# Patient Record
Sex: Male | Born: 1950 | Race: Black or African American | Hispanic: No | Marital: Single | Smoking: Former smoker
Health system: Southern US, Community
[De-identification: ages and names within clinical notes are randomized; demographics above are authoritative.]

## PROBLEM LIST (undated history)

## (undated) DIAGNOSIS — N400 Enlarged prostate without lower urinary tract symptoms: Secondary | ICD-10-CM

## (undated) DIAGNOSIS — N183 Chronic kidney disease, stage 3 unspecified: Secondary | ICD-10-CM

## (undated) DIAGNOSIS — E119 Type 2 diabetes mellitus without complications: Secondary | ICD-10-CM

## (undated) DIAGNOSIS — E559 Vitamin D deficiency, unspecified: Secondary | ICD-10-CM

## (undated) DIAGNOSIS — I10 Essential (primary) hypertension: Secondary | ICD-10-CM

## (undated) DIAGNOSIS — I639 Cerebral infarction, unspecified: Secondary | ICD-10-CM

## (undated) HISTORY — PX: NO PAST SURGERIES: SHX2092

## (undated) HISTORY — DX: Cerebral infarction, unspecified: I63.9

---

## 2016-08-03 ENCOUNTER — Emergency Department (HOSPITAL_COMMUNITY): Payer: BLUE CROSS/BLUE SHIELD

## 2016-08-03 ENCOUNTER — Inpatient Hospital Stay (HOSPITAL_COMMUNITY): Payer: BLUE CROSS/BLUE SHIELD

## 2016-08-03 ENCOUNTER — Inpatient Hospital Stay (HOSPITAL_COMMUNITY)
Admission: EM | Admit: 2016-08-03 | Discharge: 2016-08-07 | DRG: 065 | Disposition: A | Discharge status: Home Health | Payer: BLUE CROSS/BLUE SHIELD | Attending: Internal Medicine | Admitting: Internal Medicine

## 2016-08-03 ENCOUNTER — Other Ambulatory Visit: Payer: Self-pay | Admitting: Internal Medicine

## 2016-08-03 ENCOUNTER — Encounter (HOSPITAL_COMMUNITY): Payer: Self-pay | Admitting: Emergency Medicine

## 2016-08-03 DIAGNOSIS — R270 Ataxia, unspecified: Secondary | ICD-10-CM | POA: Diagnosis present

## 2016-08-03 DIAGNOSIS — I10 Essential (primary) hypertension: Secondary | ICD-10-CM

## 2016-08-03 DIAGNOSIS — Z8673 Personal history of transient ischemic attack (TIA), and cerebral infarction without residual deficits: Secondary | ICD-10-CM

## 2016-08-03 DIAGNOSIS — I6302 Cerebral infarction due to thrombosis of basilar artery: Secondary | ICD-10-CM

## 2016-08-03 DIAGNOSIS — Z9181 History of falling: Secondary | ICD-10-CM

## 2016-08-03 DIAGNOSIS — R748 Abnormal levels of other serum enzymes: Secondary | ICD-10-CM | POA: Diagnosis not present

## 2016-08-03 DIAGNOSIS — R29700 NIHSS score 0: Secondary | ICD-10-CM | POA: Diagnosis not present

## 2016-08-03 DIAGNOSIS — E1151 Type 2 diabetes mellitus with diabetic peripheral angiopathy without gangrene: Secondary | ICD-10-CM | POA: Diagnosis not present

## 2016-08-03 DIAGNOSIS — E1169 Type 2 diabetes mellitus with other specified complication: Secondary | ICD-10-CM | POA: Diagnosis not present

## 2016-08-03 DIAGNOSIS — E559 Vitamin D deficiency, unspecified: Secondary | ICD-10-CM | POA: Diagnosis present

## 2016-08-03 DIAGNOSIS — R938 Abnormal findings on diagnostic imaging of other specified body structures: Secondary | ICD-10-CM | POA: Diagnosis not present

## 2016-08-03 DIAGNOSIS — G510 Bell's palsy: Secondary | ICD-10-CM | POA: Diagnosis present

## 2016-08-03 DIAGNOSIS — R278 Other lack of coordination: Secondary | ICD-10-CM | POA: Diagnosis present

## 2016-08-03 DIAGNOSIS — N390 Urinary tract infection, site not specified: Secondary | ICD-10-CM | POA: Diagnosis not present

## 2016-08-03 DIAGNOSIS — I639 Cerebral infarction, unspecified: Principal | ICD-10-CM | POA: Diagnosis present

## 2016-08-03 DIAGNOSIS — I129 Hypertensive chronic kidney disease with stage 1 through stage 4 chronic kidney disease, or unspecified chronic kidney disease: Secondary | ICD-10-CM | POA: Diagnosis present

## 2016-08-03 DIAGNOSIS — Z8249 Family history of ischemic heart disease and other diseases of the circulatory system: Secondary | ICD-10-CM | POA: Diagnosis not present

## 2016-08-03 DIAGNOSIS — E785 Hyperlipidemia, unspecified: Secondary | ICD-10-CM | POA: Diagnosis not present

## 2016-08-03 DIAGNOSIS — Z7984 Long term (current) use of oral hypoglycemic drugs: Secondary | ICD-10-CM | POA: Diagnosis not present

## 2016-08-03 DIAGNOSIS — Z713 Dietary counseling and surveillance: Secondary | ICD-10-CM

## 2016-08-03 DIAGNOSIS — Z888 Allergy status to other drugs, medicaments and biological substances status: Secondary | ICD-10-CM

## 2016-08-03 DIAGNOSIS — R42 Dizziness and giddiness: Secondary | ICD-10-CM

## 2016-08-03 DIAGNOSIS — I509 Heart failure, unspecified: Secondary | ICD-10-CM | POA: Diagnosis present

## 2016-08-03 DIAGNOSIS — I638 Other cerebral infarction: Secondary | ICD-10-CM | POA: Diagnosis not present

## 2016-08-03 DIAGNOSIS — N400 Enlarged prostate without lower urinary tract symptoms: Secondary | ICD-10-CM | POA: Diagnosis not present

## 2016-08-03 DIAGNOSIS — N183 Chronic kidney disease, stage 3 unspecified: Secondary | ICD-10-CM | POA: Diagnosis present

## 2016-08-03 DIAGNOSIS — H532 Diplopia: Secondary | ICD-10-CM | POA: Diagnosis not present

## 2016-08-03 DIAGNOSIS — E237 Disorder of pituitary gland, unspecified: Secondary | ICD-10-CM | POA: Diagnosis not present

## 2016-08-03 DIAGNOSIS — R7989 Other specified abnormal findings of blood chemistry: Secondary | ICD-10-CM

## 2016-08-03 DIAGNOSIS — I6789 Other cerebrovascular disease: Secondary | ICD-10-CM | POA: Diagnosis not present

## 2016-08-03 DIAGNOSIS — E669 Obesity, unspecified: Secondary | ICD-10-CM | POA: Diagnosis not present

## 2016-08-03 DIAGNOSIS — G459 Transient cerebral ischemic attack, unspecified: Secondary | ICD-10-CM

## 2016-08-03 DIAGNOSIS — E876 Hypokalemia: Secondary | ICD-10-CM | POA: Diagnosis present

## 2016-08-03 DIAGNOSIS — F17210 Nicotine dependence, cigarettes, uncomplicated: Secondary | ICD-10-CM | POA: Diagnosis not present

## 2016-08-03 DIAGNOSIS — E1122 Type 2 diabetes mellitus with diabetic chronic kidney disease: Secondary | ICD-10-CM | POA: Diagnosis present

## 2016-08-03 DIAGNOSIS — Z6835 Body mass index (BMI) 35.0-35.9, adult: Secondary | ICD-10-CM

## 2016-08-03 DIAGNOSIS — R930 Abnormal findings on diagnostic imaging of skull and head, not elsewhere classified: Secondary | ICD-10-CM

## 2016-08-03 DIAGNOSIS — J208 Acute bronchitis due to other specified organisms: Secondary | ICD-10-CM | POA: Diagnosis not present

## 2016-08-03 DIAGNOSIS — D62 Acute posthemorrhagic anemia: Secondary | ICD-10-CM | POA: Diagnosis present

## 2016-08-03 DIAGNOSIS — Z716 Tobacco abuse counseling: Secondary | ICD-10-CM

## 2016-08-03 DIAGNOSIS — D72829 Elevated white blood cell count, unspecified: Secondary | ICD-10-CM

## 2016-08-03 DIAGNOSIS — I519 Heart disease, unspecified: Secondary | ICD-10-CM | POA: Diagnosis not present

## 2016-08-03 DIAGNOSIS — I5189 Other ill-defined heart diseases: Secondary | ICD-10-CM

## 2016-08-03 DIAGNOSIS — I6300 Cerebral infarction due to thrombosis of unspecified precerebral artery: Secondary | ICD-10-CM

## 2016-08-03 DIAGNOSIS — E236 Other disorders of pituitary gland: Secondary | ICD-10-CM

## 2016-08-03 DIAGNOSIS — Z79899 Other long term (current) drug therapy: Secondary | ICD-10-CM

## 2016-08-03 HISTORY — DX: Type 2 diabetes mellitus without complications: E11.9

## 2016-08-03 HISTORY — DX: Chronic kidney disease, stage 3 (moderate): N18.3

## 2016-08-03 HISTORY — DX: Chronic kidney disease, stage 3 unspecified: N18.30

## 2016-08-03 HISTORY — DX: Essential (primary) hypertension: I10

## 2016-08-03 HISTORY — DX: Vitamin D deficiency, unspecified: E55.9

## 2016-08-03 HISTORY — DX: Benign prostatic hyperplasia without lower urinary tract symptoms: N40.0

## 2016-08-03 LAB — I-STAT CHEM 8, ED
BUN: 22 mg/dL — ABNORMAL HIGH (ref 6–20)
CALCIUM ION: 1.02 mmol/L — AB (ref 1.15–1.40)
Chloride: 99 mmol/L — ABNORMAL LOW (ref 101–111)
Creatinine, Ser: 1.8 mg/dL — ABNORMAL HIGH (ref 0.61–1.24)
Glucose, Bld: 127 mg/dL — ABNORMAL HIGH (ref 65–99)
HEMATOCRIT: 37 % — AB (ref 39.0–52.0)
Hemoglobin: 12.6 g/dL — ABNORMAL LOW (ref 13.0–17.0)
Potassium: 2.6 mmol/L — CL (ref 3.5–5.1)
SODIUM: 138 mmol/L (ref 135–145)
TCO2: 25 mmol/L (ref 0–100)

## 2016-08-03 LAB — URINALYSIS, ROUTINE W REFLEX MICROSCOPIC
BILIRUBIN URINE: NEGATIVE
Glucose, UA: NEGATIVE mg/dL
KETONES UR: NEGATIVE mg/dL
NITRITE: NEGATIVE
Specific Gravity, Urine: 1.018 (ref 1.005–1.030)
pH: 6 (ref 5.0–8.0)

## 2016-08-03 LAB — COMPREHENSIVE METABOLIC PANEL
ALK PHOS: 81 U/L (ref 38–126)
ALT: 12 U/L — ABNORMAL LOW (ref 17–63)
ANION GAP: 12 (ref 5–15)
AST: 18 U/L (ref 15–41)
Albumin: 3.2 g/dL — ABNORMAL LOW (ref 3.5–5.0)
BUN: 23 mg/dL — ABNORMAL HIGH (ref 6–20)
CALCIUM: 8.6 mg/dL — AB (ref 8.9–10.3)
CO2: 26 mmol/L (ref 22–32)
Chloride: 98 mmol/L — ABNORMAL LOW (ref 101–111)
Creatinine, Ser: 1.94 mg/dL — ABNORMAL HIGH (ref 0.61–1.24)
GFR, EST AFRICAN AMERICAN: 40 mL/min — AB (ref >60)
GFR, EST NON AFRICAN AMERICAN: 35 mL/min — AB (ref >60)
Glucose, Bld: 132 mg/dL — ABNORMAL HIGH (ref 65–99)
Potassium: 2.7 mmol/L — CL (ref 3.5–5.1)
SODIUM: 136 mmol/L (ref 135–145)
Total Bilirubin: 1.1 mg/dL (ref 0.3–1.2)
Total Protein: 7.8 g/dL (ref 6.5–8.1)

## 2016-08-03 LAB — DIFFERENTIAL
BASOS PCT: 0 %
Basophils Absolute: 0 10*3/uL (ref 0.0–0.1)
EOS PCT: 0 %
Eosinophils Absolute: 0 10*3/uL (ref 0.0–0.7)
LYMPHS PCT: 12 %
Lymphs Abs: 1.6 10*3/uL (ref 0.7–4.0)
MONO ABS: 1.3 10*3/uL — AB (ref 0.1–1.0)
Monocytes Relative: 10 %
Neutro Abs: 10.7 10*3/uL — ABNORMAL HIGH (ref 1.7–7.7)
Neutrophils Relative %: 78 %

## 2016-08-03 LAB — CBC
HCT: 36.6 % — ABNORMAL LOW (ref 39.0–52.0)
Hemoglobin: 12.1 g/dL — ABNORMAL LOW (ref 13.0–17.0)
MCH: 25.5 pg — AB (ref 26.0–34.0)
MCHC: 33.1 g/dL (ref 30.0–36.0)
MCV: 77.1 fL — AB (ref 78.0–100.0)
PLATELETS: 219 10*3/uL (ref 150–400)
RBC: 4.75 MIL/uL (ref 4.22–5.81)
RDW: 14.1 % (ref 11.5–15.5)
WBC: 13.7 10*3/uL — ABNORMAL HIGH (ref 4.0–10.5)

## 2016-08-03 LAB — RAPID URINE DRUG SCREEN, HOSP PERFORMED
AMPHETAMINES: NOT DETECTED
Barbiturates: NOT DETECTED
Benzodiazepines: NOT DETECTED
Cocaine: NOT DETECTED
Opiates: NOT DETECTED
Tetrahydrocannabinol: NOT DETECTED

## 2016-08-03 LAB — PROTIME-INR
INR: 1.05
PROTHROMBIN TIME: 13.7 s (ref 11.4–15.2)

## 2016-08-03 LAB — APTT: aPTT: 21 seconds — ABNORMAL LOW (ref 24–36)

## 2016-08-03 LAB — I-STAT TROPONIN, ED: TROPONIN I, POC: 0 ng/mL (ref 0.00–0.08)

## 2016-08-03 LAB — GLUCOSE, CAPILLARY: Glucose-Capillary: 120 mg/dL — ABNORMAL HIGH (ref 65–99)

## 2016-08-03 LAB — ETHANOL: Alcohol, Ethyl (B): <5 mg/dL (ref <5)

## 2016-08-03 LAB — MAGNESIUM: MAGNESIUM: 2.2 mg/dL (ref 1.7–2.4)

## 2016-08-03 LAB — TROPONIN I: Troponin I: 0.11 ng/mL (ref <0.03)

## 2016-08-03 MED ORDER — DOXAZOSIN MESYLATE 8 MG PO TABS
8.0000 mg | ORAL_TABLET | Freq: Every day | ORAL | Status: DC
  Administered 2016-08-03 – 2016-08-06 (×4): 8 mg via ORAL
  Filled 2016-08-03 (×4): qty 1

## 2016-08-03 MED ORDER — STROKE: EARLY STAGES OF RECOVERY BOOK
Freq: Once | Status: AC
  Administered 2016-08-03

## 2016-08-03 MED ORDER — ASPIRIN EC 81 MG PO TBEC
81.0000 mg | DELAYED_RELEASE_TABLET | Freq: Every day | ORAL | Status: DC
  Administered 2016-08-04: 81 mg via ORAL
  Filled 2016-08-03: qty 1

## 2016-08-03 MED ORDER — LORAZEPAM 0.5 MG PO TABS
0.5000 mg | ORAL_TABLET | Freq: Once | ORAL | Status: AC
  Administered 2016-08-03: 0.5 mg via ORAL
  Filled 2016-08-03: qty 1

## 2016-08-03 MED ORDER — ATORVASTATIN CALCIUM 40 MG PO TABS
40.0000 mg | ORAL_TABLET | Freq: Every day | ORAL | Status: DC
  Administered 2016-08-03 – 2016-08-04 (×2): 40 mg via ORAL
  Filled 2016-08-03 (×2): qty 1

## 2016-08-03 MED ORDER — SENNOSIDES-DOCUSATE SODIUM 8.6-50 MG PO TABS: 1.0000 | ORAL_TABLET | Freq: Every evening | ORAL | Status: DC | PRN

## 2016-08-03 MED ORDER — POTASSIUM CHLORIDE CRYS ER 20 MEQ PO TBCR
40.0000 meq | EXTENDED_RELEASE_TABLET | Freq: Once | ORAL | Status: AC
  Administered 2016-08-03: 40 meq via ORAL
  Filled 2016-08-03: qty 2

## 2016-08-03 MED ORDER — POTASSIUM CHLORIDE 10 MEQ/100ML IV SOLN
10.0000 meq | INTRAVENOUS | Status: AC
  Administered 2016-08-03 (×3): 10 meq via INTRAVENOUS
  Filled 2016-08-03 (×3): qty 100

## 2016-08-03 MED ORDER — ACETAMINOPHEN 650 MG RE SUPP: 650.0000 mg | RECTAL | Status: DC | PRN

## 2016-08-03 MED ORDER — ACETAMINOPHEN 325 MG PO TABS: 650.0000 mg | ORAL_TABLET | ORAL | Status: DC | PRN

## 2016-08-03 MED ORDER — INSULIN ASPART 100 UNIT/ML ~~LOC~~ SOLN
0.0000 [IU] | Freq: Three times a day (TID) | SUBCUTANEOUS | Status: DC
  Administered 2016-08-04 – 2016-08-05 (×2): 3 [IU] via SUBCUTANEOUS

## 2016-08-03 MED ORDER — ENOXAPARIN SODIUM 40 MG/0.4ML ~~LOC~~ SOLN
40.0000 mg | SUBCUTANEOUS | Status: DC
  Administered 2016-08-03 – 2016-08-06 (×4): 40 mg via SUBCUTANEOUS
  Filled 2016-08-03 (×4): qty 0.4

## 2016-08-03 MED ORDER — DEXTROSE 5 % IV SOLN
1.0000 g | Freq: Once | INTRAVENOUS | Status: AC
  Administered 2016-08-03: 1 g via INTRAVENOUS
  Filled 2016-08-03: qty 10

## 2016-08-03 MED ORDER — POTASSIUM CHLORIDE 10 MEQ/100ML IV SOLN: 10.0000 meq | Freq: Once | INTRAVENOUS | Status: DC

## 2016-08-03 MED ORDER — ASPIRIN 81 MG PO CHEW
324.0000 mg | CHEWABLE_TABLET | Freq: Once | ORAL | Status: AC
  Administered 2016-08-03: 324 mg via ORAL
  Filled 2016-08-03: qty 4

## 2016-08-03 MED ORDER — MAGNESIUM SULFATE 2 GM/50ML IV SOLN
2.0000 g | Freq: Once | INTRAVENOUS | Status: AC
  Administered 2016-08-03: 2 g via INTRAVENOUS
  Filled 2016-08-03: qty 50

## 2016-08-03 MED ORDER — ACETAMINOPHEN 160 MG/5ML PO SOLN: 650.0000 mg | ORAL | Status: DC | PRN

## 2016-08-03 MED ORDER — SODIUM CHLORIDE 0.9 % IV SOLN
INTRAVENOUS | Status: DC
  Administered 2016-08-03: 23:00:00 via INTRAVENOUS

## 2016-08-03 NOTE — H&P (Signed)
History and Physical    Blake Moran VZC:[id number] DOB: [birth year] DOA: 08/03/2016  PCP: Karma Ganja, NP Consultants:  None Patient coming from: Home - lives with aunt and uncle; NOK: children, [phone]; [phone]  Chief Complaint: Dizziness  HPI: Blake Moran is a 66 y.o. male with medical history significant of HTN, DM, BPH, and vitamin D deficiency presenting because he has been dizzy and staggering.  Symptoms have been going on for 2 weeks.  Waxing and waning.  Today was the worst day, starting when he woke up today.  Previously with n/v but none "lately."  No dysphagia or dysarthria.  Vision gets very blurry at times.  Some diplopia, has to cover one eye to be able to see.  Slight left-sided headache this AM, resolved.  Significant difficulty with walking due to dizziness.   ED Course: Vertigo with MRI showing cerebellar stroke.  IVF/abx for ?UTI/ASA/K+/Magnesium.  Patient to be admitted to [facility] - no neurology coverage at [facility] this week.  Dr. Leonel Ramsay to see at [facility] upon arrival.  Review of Systems: As per HPI; otherwise review of systems reviewed and negative.   Ambulatory Status:  Ambualtes without assistance  Past Medical History:  Diagnosis Date  . BPH (benign prostatic hyperplasia)   . Diabetes mellitus without complication ([place])   . Hypertension   . Vitamin D deficiency     History reviewed. No pertinent surgical history.  Social History   Social History  . Marital status: Single    Spouse name: N/A  . Number of children: N/A  . Years of education: N/A   Occupational History  . handles freight    Social History Main Topics  . Smoking status: Current Every Day Smoker    Packs/day: 0.50    Years: 44.00  . Smokeless tobacco: Never Used  . Alcohol use No  . Drug use: No  . Sexual activity: Not on file   Other Topics Concern  . Not on file   Social History Narrative  . No narrative on file    Allergies  Allergen Reactions  . Lisinopril  Swelling    Family History  Problem Relation Age of Onset  . Cancer Mother 10  . CAD Father 34  . Stroke Neg Hx     Prior to Admission medications   Medication Sig Start Date End Date Taking? Authorizing Provider  DimenhyDRINATE (MOTION SICKNESS PO) Take 1 tablet by mouth daily as needed (for nausea/dizziness).   Yes [provider]  doxazosin (CARDURA) 8 MG tablet Take 8 mg by mouth at bedtime.   Yes [provider]  losartan-hydrochlorothiazide (HYZAAR) 100-12.5 MG tablet Take 1 tablet by mouth daily.   Yes [provider]  metFORMIN (GLUCOPHAGE) 500 MG tablet Take 500 mg by mouth 2 (two) times daily with a meal.   Yes [provider]    Physical Exam: Vitals:   08/03/16 1900 08/03/16 1906 08/03/16 1930 08/03/16 2030  BP: 126/80  125/77 119/67  Pulse: 79  80 85  Resp: 18  20 20   Temp:  99.7 F (37.6 C)    TempSrc:  Oral    SpO2: 96%  100% 97%  Weight:      Height:         General:  Appears calm and comfortable and is NAD Eyes:  PERRL, EOMI, normal lids, iris.  Apparent ptosis and eyebrow drooping on the left side with diplopia apparent at times since patient covers his right eye to see out of the  left. ENT:  grossly normal hearing, lips & tongue, mmm Neck:  no LAD, masses or thyromegaly Cardiovascular:  RRR, no m/r/g. No LE edema.  Respiratory:  CTA bilaterally, no w/r/r. Normal respiratory effort. Abdomen:  soft, ntnd, NABS Skin:  no rash or induration seen on limited exam Musculoskeletal:  grossly normal tone BUE/BLE, good ROM, no bony abnormality Psychiatric:  grossly normal mood and affect, speech fluent and appropriate, AOx3 Neurologic:  CN 2-12 grossly intact, moves all extremities in coordinated fashion, sensation intact.  Mild impairment in finger to nose testing.  Patient was not walked.  Labs on Admission: I have personally reviewed following labs and imaging studies  CBC:  Recent Labs Lab 08/03/16 1543 08/03/16 1613    WBC 13.7*  --   NEUTROABS 10.7*  --   HGB 12.1* 12.6*  HCT 36.6* 37.0*  MCV 77.1*  --   PLT 219  --    Basic Metabolic Panel:  Recent Labs Lab 08/03/16 1543 08/03/16 1613 08/03/16 1625  NA 136 138  --   K 2.7* 2.6*  --   CL 98* 99*  --   CO2 26  --   --   GLUCOSE 132* 127*  --   BUN 23* 22*  --   CREATININE 1.94* 1.80*  --   CALCIUM 8.6*  --   --   MG  --   --  2.2   GFR: Estimated Creatinine Clearance: 49.4 mL/min (A) (by C-G formula based on SCr of 1.8 mg/dL (H)). Liver Function Tests:  Recent Labs Lab 08/03/16 1543  AST 18  ALT 12*  ALKPHOS 81  BILITOT 1.1  PROT 7.8  ALBUMIN 3.2*   No results for input(s): LIPASE, AMYLASE in the last 168 hours. No results for input(s): AMMONIA in the last 168 hours. Coagulation Profile:  Recent Labs Lab 08/03/16 1543  INR 1.05   Cardiac Enzymes: No results for input(s): CKTOTAL, CKMB, CKMBINDEX, TROPONINI in the last 168 hours. BNP (last 3 results) No results for input(s): PROBNP in the last 8760 hours. HbA1C: No results for input(s): HGBA1C in the last 72 hours. CBG: No results for input(s): GLUCAP in the last 168 hours. Lipid Profile: No results for input(s): CHOL, HDL, LDLCALC, TRIG, CHOLHDL, LDLDIRECT in the last 72 hours. Thyroid Function Tests: No results for input(s): TSH, T4TOTAL, FREET4, T3FREE, THYROIDAB in the last 72 hours. Anemia Panel: No results for input(s): VITAMINB12, FOLATE, FERRITIN, TIBC, IRON, RETICCTPCT in the last 72 hours. Urine analysis:    Component Value Date/Time   COLORURINE AMBER (A) 08/03/2016 1541   APPEARANCEUR HAZY (A) 08/03/2016 1541   LABSPEC 1.018 08/03/2016 1541   PHURINE 6.0 08/03/2016 1541   GLUCOSEU NEGATIVE 08/03/2016 1541   HGBUR SMALL (A) 08/03/2016 1541   BILIRUBINUR NEGATIVE 08/03/2016 1541   KETONESUR NEGATIVE 08/03/2016 1541   PROTEINUR >=300 (A) 08/03/2016 1541   NITRITE NEGATIVE 08/03/2016 1541   LEUKOCYTESUR MODERATE (A) 08/03/2016 1541    Creatinine  Clearance: Estimated Creatinine Clearance: 49.4 mL/min (A) (by C-G formula based on SCr of 1.8 mg/dL (H)).  Sepsis Labs: @LABRCNTIP (procalcitonin:4,lacticidven:4) ) Recent Results (from the past 240 hour(s))  Blood culture (routine x 2)     Status: None (Preliminary result)   Collection Time: 08/03/16  5:50 PM  Result Value Ref Range Status   Specimen Description LEFT ANTECUBITAL  Final   Special Requests   Final    BOTTLES DRAWN AEROBIC AND ANAEROBIC Blood Culture adequate volume   Culture PENDING  Incomplete  Report Status PENDING  Incomplete  Blood culture (routine x 2)     Status: None (Preliminary result)   Collection Time: 08/03/16  5:50 PM  Result Value Ref Range Status   Specimen Description BLOOD LEFT HAND  Final   Special Requests   Final    BOTTLES DRAWN AEROBIC AND ANAEROBIC Blood Culture adequate volume   Culture PENDING  Incomplete   Report Status PENDING  Incomplete     Radiological Exams on Admission: Mr Virgel Paling TD Contrast  Result Date: 08/03/2016 CLINICAL DATA:  Altered mental status and weakness for 2 days. Recent fall. History of hypertension and diabetes. Assess for stroke. EXAM: MRI HEAD WITHOUT CONTRAST MRA HEAD WITHOUT CONTRAST TECHNIQUE: Multiplanar, multiecho pulse sequences of the brain and surrounding structures were obtained without intravenous contrast. Angiographic images of the head were obtained using MRA technique without contrast. COMPARISON:  None. FINDINGS: MRI HEAD FINDINGS- mildly motion degraded examination. BRAIN: Patchy reduced diffusion LEFT brachium pontis with low ADC values. No susceptibility artifact to suggest hemorrhage. Faint susceptibility artifact associated with old RIGHT basal ganglia infarct. Old bilateral thalamus and bilateral basal ganglia lacunar infarcts. Patchy to confluent supratentorial white matter FLAIR T2 hyperintensities. Old LEFT cerebellum and pontine lacunar infarcts. Ventricles and sulci are normal for patient's age.  No midline shift, mass effect or masses. No abnormal extra-axial fluid collections. VASCULAR: Major intracranial vascular flow voids present at skull base. SKULL AND UPPER CERVICAL SPINE: Expanded sella with heterogeneous possible mass along the inferior margin. No suspicious calvarial bone marrow signal. Craniocervical junction maintained. SINUSES/ORBITS: Trace paranasal sinus mucosal thickening. Mastoid air cells are well aerated. The included ocular globes and orbital contents are non-suspicious. OTHER: None. MRA HEAD FINDINGS- severe motion through circle of Willis. ANTERIOR CIRCULATION: Normal flow related enhancement of the included cervical, petrous, cavernous and supraclinoid internal carotid arteries. Patent anterior communicating artery. Normal flow related enhancement of the anterior and middle cerebral arteries, including distal segments. Signal dropout through proximal M2 segments due to motion. No large vessel occlusion or aneurysm. POSTERIOR CIRCULATION: RIGHT vertebral artery is dominant. Basilar artery is patent, with normal flow related enhancement of the main branch vessels. Flow related enhancement bilateral posterior cerebral artery's though, signal loss due to motion. No large vessel occlusion or aneurysm. 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 Source images and MIP images were reviewed. IMPRESSION: MRI HEAD: 1. Acute LEFT brachium pontis infarcts. 2. 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 3. Moderate to severe chronic small vessel ischemic disease. 4. 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 MRA HEAD: 1. Limited examination: Severe motion through circle of Willis. 2. No emergent large vessel occlusion. Electronically Signed   By: Elon Alas M.D.   On: 08/03/2016 17:31   Mr Brain Wo Contrast  Result Date: 08/03/2016 CLINICAL DATA:  Altered  mental status and weakness for 2 days. Recent fall. History of hypertension and diabetes. Assess for stroke. EXAM: MRI HEAD WITHOUT CONTRAST MRA HEAD WITHOUT CONTRAST TECHNIQUE: Multiplanar, multiecho pulse sequences of the brain and surrounding structures were obtained without intravenous contrast. Angiographic images of the head were obtained using MRA technique without contrast. COMPARISON:  None. FINDINGS: MRI HEAD FINDINGS- mildly motion degraded examination. BRAIN: Patchy reduced diffusion LEFT brachium pontis with low ADC values. No susceptibility artifact to suggest hemorrhage. Faint susceptibility artifact associated with old RIGHT basal ganglia infarct. Old bilateral thalamus and bilateral basal ganglia lacunar infarcts. Patchy  to confluent supratentorial white matter FLAIR T2 hyperintensities. Old LEFT cerebellum and pontine lacunar infarcts. Ventricles and sulci are normal for patient's age. No midline shift, mass effect or masses. No abnormal extra-axial fluid collections. VASCULAR: Major intracranial vascular flow voids present at skull base. SKULL AND UPPER CERVICAL SPINE: Expanded sella with heterogeneous possible mass along the inferior margin. No suspicious calvarial bone marrow signal. Craniocervical junction maintained. SINUSES/ORBITS: Trace paranasal sinus mucosal thickening. Mastoid air cells are well aerated. The included ocular globes and orbital contents are non-suspicious. OTHER: None. MRA HEAD FINDINGS- severe motion through circle of Willis. ANTERIOR CIRCULATION: Normal flow related enhancement of the included cervical, petrous, cavernous and supraclinoid internal carotid arteries. Patent anterior communicating artery. Normal flow related enhancement of the anterior and middle cerebral arteries, including distal segments. Signal dropout through proximal M2 segments due to motion. No large vessel occlusion or aneurysm. POSTERIOR CIRCULATION: RIGHT vertebral artery is dominant. Basilar  artery is patent, with normal flow related enhancement of the main branch vessels. Flow related enhancement bilateral posterior cerebral artery's though, signal loss due to motion. No large vessel occlusion or aneurysm. 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 Source images and MIP images were reviewed. IMPRESSION: MRI HEAD: 1. Acute LEFT brachium pontis infarcts. 2. 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 3. Moderate to severe chronic small vessel ischemic disease. 4. 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 MRA HEAD: 1. Limited examination: Severe motion through circle of Willis. 2. No emergent large vessel occlusion. Electronically Signed   By: Elon Alas M.D.   On: 08/03/2016 17:31    EKG: Independently reviewed.  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   Essential hypertension   CVA -Marked vertigo for several (up to 6?) weeks with significant worsening today -MRI shows cerebellar infarcts, acute; also with old prior infarcts -Will admit to [facility] for CVA evaluation (no neurology support at [facility] this week) -Telemetry monitoring -Carotid dopplers -Echo -Risk stratification with FLP, A1c; will also check TSH and UDS (done, negative) -ASA daily - he did start taking it within the last few days but this likely does not represent failure of primary prevention -Neurology consult upon arrival at [facility] -PT/OT/ST/Nutrition Consults -SW consult for placement -WBC 13.7, likely reactive - UA is somewhat abnormal (UA: rare bacteria, small Hgb, moderate LE, negative nitrite, >300 protein,  TNTC WBC) but patient is asymptomatic so will not plan to treat further at this time beyond single dose of Rocephin given in the ER  HTN -Allow permissive HTN -Treat BP only if >220/120, and then with goal of 15% reduction -Hold ARB and HCTZ as well as BB and plan to restart in 48-72 hours (Will continue Cardura for BPH for now)  HLD -Check FLP -Will plan to start statin therapy with Lipitor 40 mg qhs for now  DM -Glucose 132 -Hold Glucophage -Check A1c -Will cover with moderate-scale SSI  Hypokalemia -K+ 2.7 -Magnesium 2.2 -He received 10 mEq KCl IV and 40 Meq KCl PO in ER -Will give an additional 40 mEq KCl PO x 1 at midnight -This would be expected to increase his K+ to  3.6 -Will recheck K+ in the AM -Was also given IV Mag sulfate in the ER  CKD -BUN 23/Creatinine 6.28/MNO 40 -Uncertain baseline but given long-standing HTN this is presumed to be CKD rather than acute -Will follow creatinine  Sellar mass -Needs further imaging -Will defer to neurology about whether this needs to occur as an inpatient or outpatient   DVT prophylaxis: Lovenox  Code Status:  Full - confirmed with patient/family Family Communication: Children and grandson present throughout evaluation  Disposition Plan: To be determined Consults called: Neurology  Admission status: Admit - It is my clinical opinion that admission to INPATIENT is reasonable and necessary because this patient will require at least 2 midnights in the hospital to treat this condition based on the medical complexity of the problems presented.  Given the aforementioned information, the predictability of an adverse outcome is felt to be significant.    Karmen Bongo MD [facility]  If 7PM-7AM, please contact night-coverage [email or web address] Password [facility]  08/03/2016, 9:46 PM

## 2016-08-03 NOTE — ED Notes (Signed)
Pt ate 100% of dinner tray

## 2016-08-03 NOTE — ED Notes (Signed)
Pt taken to MRI  

## 2016-08-03 NOTE — ED Provider Notes (Signed)
[facility] DEPT Provider Note   CSN: [id number] Arrival date & time: 08/03/16  1439     History   Chief Complaint Chief Complaint  Patient presents with  . Dizziness    HPI Blake Moran is a 66 y.o. male.   Dizziness  Quality:  Head spinning Severity:  Moderate Duration:  2 weeks Timing:  Intermittent Progression:  Worsening Chronicity:  New Context: head movement   Relieved by:  None tried Worsened by:  Nothing Ineffective treatments:  None tried Associated symptoms: nausea, syncope (sometimes), vomiting and weakness     Past Medical History:  Diagnosis Date  . Diabetes mellitus without complication ([place])   . Hypertension     There are no active problems to display for this patient.   History reviewed. No pertinent surgical history.     Home Medications    Prior to Admission medications   Medication Sig Start Date End Date Taking? Authorizing Provider  DimenhyDRINATE (MOTION SICKNESS PO) Take 1 tablet by mouth daily as needed (for nausea/dizziness).   Yes [provider]  doxazosin (CARDURA) 8 MG tablet Take 8 mg by mouth at bedtime.   Yes [provider]  losartan-hydrochlorothiazide (HYZAAR) 100-12.5 MG tablet Take 1 tablet by mouth daily.   Yes [provider]  metFORMIN (GLUCOPHAGE) 500 MG tablet Take 500 mg by mouth 2 (two) times daily with a meal.   Yes [provider]    Family History History reviewed. No pertinent family history.  Social History Social History  Substance Use Topics  . Smoking status: Current Every Day Smoker  . Smokeless tobacco: Never Used  . Alcohol use No     Allergies   Lisinopril   Review of Systems Review of Systems  Cardiovascular: Positive for syncope (sometimes).  Gastrointestinal: Positive for nausea and vomiting.  Neurological: Positive for dizziness and weakness.  All other systems reviewed and are negative.    Physical Exam Updated Vital Signs BP 137/80    Pulse 80   Temp 100.3 F (37.9 C)   Resp 17   Ht 5\' 8"  (1.727 m)   Wt 108 kg (238 lb)   SpO2 95%   BMI 36.19 kg/m   Physical Exam  Constitutional: He is oriented to person, place, and time. He appears well-developed and well-nourished.  HENT:  Head: Normocephalic and atraumatic.  Eyes: Conjunctivae and EOM are normal.  Neck: Normal range of motion.  Cardiovascular: Normal rate.   Pulmonary/Chest: Effort normal. No respiratory distress.  Abdominal: He exhibits no distension.  Musculoskeletal: Normal range of motion.  Neurological: He is alert and oriented to person, place, and time. He displays normal reflexes. A cranial nerve deficit (decreased ability to raise left eyebrow) is present. No sensory deficit. He exhibits normal muscle tone. Coordination (dysmetria with left finger to nose) abnormal.  Skin: Skin is warm and dry.  Nursing note and vitals reviewed.    ED Treatments / Results  Labs (all labs ordered are listed, but only abnormal results are displayed) Labs Reviewed  APTT - Abnormal; Notable for the following:       Result Value   aPTT 21 (*)    All other components within normal limits  CBC - Abnormal; Notable for the following:    WBC 13.7 (*)    Hemoglobin 12.1 (*)    HCT 36.6 (*)    MCV 77.1 (*)    MCH 25.5 (*)    All other components within normal limits  DIFFERENTIAL - Abnormal; Notable  for the following:    Neutro Abs 10.7 (*)    Monocytes Absolute 1.3 (*)    All other components within normal limits  COMPREHENSIVE METABOLIC PANEL - Abnormal; Notable for the following:    Potassium 2.7 (*)    Chloride 98 (*)    Glucose, Bld 132 (*)    BUN 23 (*)    Creatinine, Ser 1.94 (*)    Calcium 8.6 (*)    Albumin 3.2 (*)    ALT 12 (*)    GFR calc non Af Amer 35 (*)    GFR calc Af Amer 40 (*)    All other components within normal limits  URINALYSIS, ROUTINE W REFLEX MICROSCOPIC - Abnormal; Notable for the following:    Color, Urine AMBER (*)     APPearance HAZY (*)    Hgb urine dipstick SMALL (*)    Protein, ur >=300 (*)    Leukocytes, UA MODERATE (*)    Bacteria, UA RARE (*)    Squamous Epithelial / LPF 0-5 (*)    All other components within normal limits  I-STAT CHEM 8, ED - Abnormal; Notable for the following:    Potassium 2.6 (*)    Chloride 99 (*)    BUN 22 (*)    Creatinine, Ser 1.80 (*)    Glucose, Bld 127 (*)    Calcium, Ion 1.02 (*)    Hemoglobin 12.6 (*)    HCT 37.0 (*)    All other components within normal limits  URINE CULTURE  CULTURE, BLOOD (ROUTINE X 2)  CULTURE, BLOOD (ROUTINE X 2)  ETHANOL  PROTIME-INR  RAPID URINE DRUG SCREEN, HOSP PERFORMED  MAGNESIUM  I-STAT TROPOININ, ED    EKG  EKG Interpretation  Date/Time:  Monday August 03 2016 15:05:09 EDT Ventricular Rate:  81 PR Interval:    QRS Duration: 103 QT Interval:  381 QTC Calculation: 443 R Axis:   31 Text Interpretation:  Sinus rhythm Abnormal R-wave progression, early transition No old tracing to compare Confirmed by Merrily Pew [phone]) on 08/03/2016 4:00:31 PM       Radiology Mr Jodene Nam Head Wo Contrast  Result Date: 08/03/2016 CLINICAL DATA:  Altered mental status and weakness for 2 days. Recent fall. History of hypertension and diabetes. Assess for stroke. EXAM: MRI HEAD WITHOUT CONTRAST MRA HEAD WITHOUT CONTRAST TECHNIQUE: Multiplanar, multiecho pulse sequences of the brain and surrounding structures were obtained without intravenous contrast. Angiographic images of the head were obtained using MRA technique without contrast. COMPARISON:  None. FINDINGS: MRI HEAD FINDINGS- mildly motion degraded examination. BRAIN: Patchy reduced diffusion LEFT brachium pontis with low ADC values. No susceptibility artifact to suggest hemorrhage. Faint susceptibility artifact associated with old RIGHT basal ganglia infarct. Old bilateral thalamus and bilateral basal ganglia lacunar infarcts. Patchy to confluent supratentorial white matter FLAIR T2  hyperintensities. Old LEFT cerebellum and pontine lacunar infarcts. Ventricles and sulci are normal for patient's age. No midline shift, mass effect or masses. No abnormal extra-axial fluid collections. VASCULAR: Major intracranial vascular flow voids present at skull base. SKULL AND UPPER CERVICAL SPINE: Expanded sella with heterogeneous possible mass along the inferior margin. No suspicious calvarial bone marrow signal. Craniocervical junction maintained. SINUSES/ORBITS: Trace paranasal sinus mucosal thickening. Mastoid air cells are well aerated. The included ocular globes and orbital contents are non-suspicious. OTHER: None. MRA HEAD FINDINGS- severe motion through circle of Willis. ANTERIOR CIRCULATION: Normal flow related enhancement of the included cervical, petrous, cavernous and supraclinoid internal carotid arteries. Patent anterior communicating artery. Normal  flow related enhancement of the anterior and middle cerebral arteries, including distal segments. Signal dropout through proximal M2 segments due to motion. No large vessel occlusion or aneurysm. POSTERIOR CIRCULATION: RIGHT vertebral artery is dominant. Basilar artery is patent, with normal flow related enhancement of the main branch vessels. Flow related enhancement bilateral posterior cerebral artery's though, signal loss due to motion. No large vessel occlusion or aneurysm. 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 Source images and MIP images were reviewed. IMPRESSION: MRI HEAD: 1. Acute LEFT brachium pontis infarcts. 2. 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 3. Moderate to severe chronic small vessel ischemic disease. 4. 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 MRA HEAD: 1. Limited examination: Severe motion through circle of Willis. 2. No emergent large vessel occlusion. Electronically Signed   By:  Elon Alas M.D.   On: 08/03/2016 17:31   Mr Brain Wo Contrast  Result Date: 08/03/2016 CLINICAL DATA:  Altered mental status and weakness for 2 days. Recent fall. History of hypertension and diabetes. Assess for stroke. EXAM: MRI HEAD WITHOUT CONTRAST MRA HEAD WITHOUT CONTRAST TECHNIQUE: Multiplanar, multiecho pulse sequences of the brain and surrounding structures were obtained without intravenous contrast. Angiographic images of the head were obtained using MRA technique without contrast. COMPARISON:  None. FINDINGS: MRI HEAD FINDINGS- mildly motion degraded examination. BRAIN: Patchy reduced diffusion LEFT brachium pontis with low ADC values. No susceptibility artifact to suggest hemorrhage. Faint susceptibility artifact associated with old RIGHT basal ganglia infarct. Old bilateral thalamus and bilateral basal ganglia lacunar infarcts. Patchy to confluent supratentorial white matter FLAIR T2 hyperintensities. Old LEFT cerebellum and pontine lacunar infarcts. Ventricles and sulci are normal for patient's age. No midline shift, mass effect or masses. No abnormal extra-axial fluid collections. VASCULAR: Major intracranial vascular flow voids present at skull base. SKULL AND UPPER CERVICAL SPINE: Expanded sella with heterogeneous possible mass along the inferior margin. No suspicious calvarial bone marrow signal. Craniocervical junction maintained. SINUSES/ORBITS: Trace paranasal sinus mucosal thickening. Mastoid air cells are well aerated. The included ocular globes and orbital contents are non-suspicious. OTHER: None. MRA HEAD FINDINGS- severe motion through circle of Willis. ANTERIOR CIRCULATION: Normal flow related enhancement of the included cervical, petrous, cavernous and supraclinoid internal carotid arteries. Patent anterior communicating artery. Normal flow related enhancement of the anterior and middle cerebral arteries, including distal segments. Signal dropout through proximal M2 segments due  to motion. No large vessel occlusion or aneurysm. POSTERIOR CIRCULATION: RIGHT vertebral artery is dominant. Basilar artery is patent, with normal flow related enhancement of the main branch vessels. Flow related enhancement bilateral posterior cerebral artery's though, signal loss due to motion. No large vessel occlusion or aneurysm. 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 Source images and MIP images were reviewed. IMPRESSION: MRI HEAD: 1. Acute LEFT brachium pontis infarcts. 2. 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 3. Moderate to severe chronic small vessel ischemic disease. 4. 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 MRA HEAD: 1. Limited examination: Severe motion through circle of Willis. 2. No emergent large vessel occlusion. Electronically Signed   By: Elon Alas M.D.   On: 08/03/2016 17:31    Procedures Procedures (including critical care time)  CRITICAL CARE Performed by: Merrily Pew Total critical care time: 35 minutes Critical care time was exclusive of separately billable procedures and treating other patients.  Critical care was necessary to treat or prevent imminent or life-threatening deterioration. Critical care was time spent personally by me on the following activities: development of treatment plan with patient and/or surrogate as well as nursing, discussions with consultants, evaluation of patient's response to treatment, examination of patient, obtaining history from patient or surrogate, ordering and performing treatments and interventions, ordering and review of laboratory studies, ordering and review of radiographic studies, pulse oximetry and re-evaluation of patient's condition.   Medications Ordered in ED Medications  magnesium sulfate IVPB 2 g 50 mL (2 g Intravenous New Bag/Given 08/03/16 1721)  potassium chloride 10 mEq in  100 mL IVPB (10 mEq Intravenous New Bag/Given 08/03/16 1723)  LORazepam (ATIVAN) tablet 0.5 mg (0.5 mg Oral Given 08/03/16 1647)  potassium chloride SA (K-DUR,KLOR-CON) CR tablet 40 mEq (40 mEq Oral Given 08/03/16 1647)  cefTRIAXone (ROCEPHIN) 1 g in dextrose 5 % 50 mL IVPB (0 g Intravenous Stopped 08/03/16 1721)     Initial Impression / Assessment and Plan / ED Course  I have reviewed the triage vital signs and the nursing notes.  Pertinent labs & imaging results that were available during my care of the patient were reviewed by me and considered in my medical decision making (see chart for details).    Will eval for stroke v vertigo  Workup c/w cerebellar stroke as likely cause for vertigo. Also with likely UTI, hypokalemia nad questionable AKI.  Fluids/abx/aspirin/potassium/magnesium Will admit.  Discussed with Dr. Lorin Mercy who will admit to [facility] as we don't have neuro here.  I discussed with Dr. Leonel Ramsay at [facility] who agrees to see patient when he gets there, requests a page when he arrives.   Final Clinical Impressions(s) / ED Diagnoses   Final diagnoses:  Vertigo  Cerebrovascular accident (CVA), unspecified mechanism ([place])  Urinary tract infection without hematuria, site unspecified  Hypokalemia  Elevated serum creatinine     Merrily Pew, MD 08/03/16 1821

## 2016-08-03 NOTE — ED Notes (Signed)
Pt just returned from mri 

## 2016-08-03 NOTE — ED Notes (Signed)
Pt given meal tray and sprite zero

## 2016-08-03 NOTE — ED Notes (Signed)
Okay for pt to have heart healthy, diabetic diet.

## 2016-08-03 NOTE — ED Notes (Signed)
Date and time results received: 08/03/16 1645  Test: K+ Critical Value: 2.7  Name of Provider Notified: [place]  Orders Received? Or Actions Taken?: orders placed

## 2016-08-03 NOTE — ED Triage Notes (Signed)
Patient complaining of dizziness and weakness off and on x 2 weeks. States he was seen by PCP 2 weeks ago and told he had vertigo. Patient given motion sickness medication with no relief. States he had syncopal episode last week and was treated at [facility]. States he was told he had TIA. Complaining of constant dizziness since yesterday. Denies pain.

## 2016-08-04 ENCOUNTER — Encounter (HOSPITAL_COMMUNITY): Payer: Self-pay | Admitting: General Practice

## 2016-08-04 ENCOUNTER — Inpatient Hospital Stay (HOSPITAL_COMMUNITY): Payer: BLUE CROSS/BLUE SHIELD

## 2016-08-04 DIAGNOSIS — R42 Dizziness and giddiness: Secondary | ICD-10-CM

## 2016-08-04 DIAGNOSIS — I6302 Cerebral infarction due to thrombosis of basilar artery: Secondary | ICD-10-CM

## 2016-08-04 DIAGNOSIS — N390 Urinary tract infection, site not specified: Secondary | ICD-10-CM

## 2016-08-04 DIAGNOSIS — I6789 Other cerebrovascular disease: Secondary | ICD-10-CM

## 2016-08-04 DIAGNOSIS — R7989 Other specified abnormal findings of blood chemistry: Secondary | ICD-10-CM

## 2016-08-04 LAB — ECHOCARDIOGRAM COMPLETE
AV Area VTI index: 0.95 cm2/m2
AV Area VTI: 1.95 cm2
AV Peak grad: 23 mmHg
AV VEL mean LVOT/AV: 0.57
AV pk vel: 240 cm/s
AV vel: 2.2
AVA: 2.2 cm2
AVAREAMEANV: 1.99 cm2
AVAREAMEANVIN: 0.86 cm2/m2
AVG: 12 mmHg
Ao pk vel: 0.56 m/s
Ao-asc: 29 cm
Area-P 1/2: 1.96 cm2
CHL CUP AV PEAK INDEX: 0.85
CHL CUP DOP CALC LVOT VTI: 25.6 cm
CHL CUP REG VEL DIAS: 109 cm/s
DOP CAL AO MEAN VELOCITY: 157 cm/s
E decel time: 384 msec
E/e' ratio: 7.82
FS: 33 % (ref 28–44)
Height: 68 in
IVS/LV PW RATIO, ED: 1.39
LA diam end sys: 36 mm
LA vol A4C: 51.1 ml
LA vol index: 22.7 mL/m2
LA vol: 52.4 mL
LADIAMINDEX: 1.56 cm/m2
LASIZE: 36 mm
LDCA: 3.46 cm2
LV E/e'average: 7.82
LV TDI E'MEDIAL: 5.66
LV e' LATERAL: 9.79 cm/s
LVEEMED: 7.82
LVOT peak grad rest: 7 mmHg
LVOTD: 21 mm
LVOTPV: 135 cm/s
LVOTSV: 89 mL
LVOTVTI: 0.64 cm
MV Dec: 384
MV Peak grad: 2 mmHg
MVPKAVEL: 119 m/s
MVPKEVEL: 76.6 m/s
MVSPHT: 112 ms
PW: 11.8 mm — AB (ref 0.6–1.1)
RV LATERAL S' VELOCITY: 15.8 cm/s
RV TAPSE: 32 mm
TDI e' lateral: 9.79
VTI: 40.3 cm
Valve area index: 0.95
Weight: 3772.8 oz

## 2016-08-04 LAB — BASIC METABOLIC PANEL
Anion gap: 10 (ref 5–15)
BUN: 22 mg/dL — AB (ref 6–20)
CALCIUM: 8.2 mg/dL — AB (ref 8.9–10.3)
CHLORIDE: 105 mmol/L (ref 101–111)
CO2: 21 mmol/L — AB (ref 22–32)
CREATININE: 1.68 mg/dL — AB (ref 0.61–1.24)
GFR calc Af Amer: 48 mL/min — ABNORMAL LOW (ref >60)
GFR calc non Af Amer: 41 mL/min — ABNORMAL LOW (ref >60)
GLUCOSE: 112 mg/dL — AB (ref 65–99)
Potassium: 3.3 mmol/L — ABNORMAL LOW (ref 3.5–5.1)
Sodium: 136 mmol/L (ref 135–145)

## 2016-08-04 LAB — LIPID PANEL
CHOL/HDL RATIO: 4.2 ratio
Cholesterol: 123 mg/dL (ref 0–200)
HDL: 29 mg/dL — AB (ref >40)
LDL CALC: 70 mg/dL (ref 0–99)
Triglycerides: 122 mg/dL (ref <150)
VLDL: 24 mg/dL (ref 0–40)

## 2016-08-04 LAB — GLUCOSE, CAPILLARY
GLUCOSE-CAPILLARY: 114 mg/dL — AB (ref 65–99)
GLUCOSE-CAPILLARY: 195 mg/dL — AB (ref 65–99)
Glucose-Capillary: 94 mg/dL (ref 65–99)
Glucose-Capillary: 109 mg/dL — ABNORMAL HIGH (ref 65–99)

## 2016-08-04 LAB — CBC
HCT: 34.1 % — ABNORMAL LOW (ref 39.0–52.0)
Hemoglobin: 10.9 g/dL — ABNORMAL LOW (ref 13.0–17.0)
MCH: 24.5 pg — AB (ref 26.0–34.0)
MCHC: 32 g/dL (ref 30.0–36.0)
MCV: 76.8 fL — AB (ref 78.0–100.0)
PLATELETS: 212 10*3/uL (ref 150–400)
RBC: 4.44 MIL/uL (ref 4.22–5.81)
RDW: 14 % (ref 11.5–15.5)
WBC: 10.8 10*3/uL — ABNORMAL HIGH (ref 4.0–10.5)

## 2016-08-04 LAB — TROPONIN I
Troponin I: 0.11 ng/mL (ref <0.03)
Troponin I: <0.03 ng/mL (ref <0.03)

## 2016-08-04 LAB — HIV ANTIBODY (ROUTINE TESTING W REFLEX): HIV Screen 4th Generation wRfx: NONREACTIVE

## 2016-08-04 MED ORDER — IPRATROPIUM-ALBUTEROL 0.5-2.5 (3) MG/3ML IN SOLN
3.0000 mL | Freq: Four times a day (QID) | RESPIRATORY_TRACT | Status: DC
  Filled 2016-08-04: qty 3

## 2016-08-04 MED ORDER — IPRATROPIUM-ALBUTEROL 0.5-2.5 (3) MG/3ML IN SOLN: 3.0000 mL | Freq: Four times a day (QID) | RESPIRATORY_TRACT | Status: DC | PRN

## 2016-08-04 MED ORDER — IOPAMIDOL (ISOVUE-370) INJECTION 76%
INTRAVENOUS | Status: AC
  Administered 2016-08-04: 50 mL
  Filled 2016-08-04: qty 50

## 2016-08-04 MED ORDER — ATORVASTATIN CALCIUM 10 MG PO TABS: 20.0000 mg | ORAL_TABLET | Freq: Every day | ORAL | Status: DC

## 2016-08-04 MED ORDER — NEBIVOLOL HCL 10 MG PO TABS
20.0000 mg | ORAL_TABLET | Freq: Every day | ORAL | Status: DC
  Administered 2016-08-04 – 2016-08-07 (×4): 20 mg via ORAL
  Filled 2016-08-04 (×4): qty 2

## 2016-08-04 MED ORDER — ASPIRIN 81 MG PO CHEW
162.0000 mg | CHEWABLE_TABLET | Freq: Once | ORAL | Status: AC
  Administered 2016-08-04: 162 mg via ORAL
  Filled 2016-08-04: qty 2

## 2016-08-04 MED ORDER — GADOBENATE DIMEGLUMINE 529 MG/ML IV SOLN
10.0000 mL | Freq: Once | INTRAVENOUS | Status: AC | PRN
  Administered 2016-08-04: 10 mL via INTRAVENOUS

## 2016-08-04 MED ORDER — POTASSIUM CHLORIDE CRYS ER 20 MEQ PO TBCR
40.0000 meq | EXTENDED_RELEASE_TABLET | ORAL | Status: AC
  Administered 2016-08-04 (×2): 40 meq via ORAL
  Filled 2016-08-04 (×2): qty 2

## 2016-08-04 MED ORDER — ATORVASTATIN CALCIUM 10 MG PO TABS
20.0000 mg | ORAL_TABLET | Freq: Every day | ORAL | Status: DC
  Administered 2016-08-05 – 2016-08-06 (×2): 20 mg via ORAL
  Filled 2016-08-04 (×2): qty 2

## 2016-08-04 NOTE — Progress Notes (Signed)
Blake Blinks, NP was notified about Troponin 0.11. Patient is currently pain free. No new orders. Continue to monitor the patient.

## 2016-08-04 NOTE — Progress Notes (Addendum)
PROGRESS NOTE    Blake Moran   QIO:[id number]  DOB: April 15, 1950  DOA: 08/03/2016 PCP: Karma Ganja, NP   Brief Narrative:  66 y/o with HTN, DM 2, cigarette smoker, BPH who present with dizziness for 2 wks and trouble walking. He has had on and off double vision and blurry vision.  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   Subjective: Feels like room is spinning when he sits up and when he walks. Vision is blurry. No double vision. No focal weakness or numbness. ROS: 2- 3 days of cough with yellow sputum, no fevers, mild dyspnea on exertion No nausea or vomiting.  No pain, diarrhea or dysuria.   Assessment & Plan:   Principal Problem:   CVA (cerebral vascular accident)  - MRI >>  Left pontine ischemic infarct- affecting vision and gait - MRA>> Old basal ganglia, thalamus, pontine and LEFT cerebellar Infarcts, Moderate to severe chronic small vessel ischemic disease, suspicious for sellar mass- recommending MRI sella protocol with contrast is recommended on a nonemergent basis. 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 - discussed with Neuro today- recommended outpt follow up MRI  Cough/ yellow sputum in a cigarette smoker - started about 3 days ago- likely has acute viral bronchitis- no wheezing at this time and so no steroids needed - will order Nebs QID- hold off on antibiotic for now and follow - need outpt eval for COPD with PFTs - needs to quit smoking    Hypokalemia - K - 2.6>>  3.4 - Mg normal at 2.2 - likley due to HCTZ - replace  Mild elevated troponin - 0.11>>  0.11 and then < 0.03 - EKG reviewed- unrevealing - likely due to CKD- no recent chest pain- DOE may be due to above respiratory infection - ECHO shows normal wall motion    CKD (chronic kidney disease), stage III - stable    Diabetes mellitus type 2 in obese (HCC) - states sugars when checked in the AM are usually < 150 - cont SSI - Metformin on hold    Essential hypertension - can resume Bystolic today - hold losartan/ HCTZ for now (received contrast- has CKD 3)   BPH - Cardura resumed on admission   Pyruia - in setting of BPH - asymptomatic - hold off on treating   DVT prophylaxis: Lovenox Code Status: Full code Family Communication:  Disposition Plan: CIR recommended by OT Consultants:   neurology Procedures:   2 D ECHO  LVEF 60-65%, moderate LVH, normal wall motion, grade 1 DD,   indeterminate LV filling pressure, mild aortic stenosis - AVA   around 2.0 cm2, MAC with trivial MR, normal LA size, normal IVC.   Antimicrobials:  Anti-infectives    Start     Dose/Rate Route Frequency Ordered Stop   08/03/16 1630  cefTRIAXone (ROCEPHIN) 1 g in dextrose 5 % 50 mL IVPB     1 g 100  mL/hr over 30 Minutes Intravenous  Once 08/03/16 1623 08/03/16 1721       Objective: Vitals:   08/04/16 0130 08/04/16 0400 08/04/16 0537 08/04/16 0737  BP: 125/73 122/74 123/80 104/67  Pulse: 89 88 87   Resp: _0 Temp: 99 F (37.2 C) 99.3 F (37.4 C) 97.4 F (36.3 C) 98.8 F (37.1 C)  TempSrc: Oral Oral Oral Oral  SpO2: 100% 100% 99% 96%  Weight:      Height:        Intake/Output Summary (Last 24 hours) at 08/04/16 1556 Last data filed at 08/04/16 1121  Gross per 24 hour  Intake             1015 ml  Output              300 ml  Net              715 ml   Filed Weights   08/03/16 1451 08/03/16 2154  Weight: 108 kg (238 lb) 107 kg (235 lb 12.8 oz)     Examination: General exam: Appears comfortable  HEENT: PERRLA, oral mucosa moist, no sclera icterus or thrush Respiratory system: Clear to auscultation. Respiratory effort normal. Cardiovascular system: S1 & S2 heard, RRR.  No murmurs  Gastrointestinal system: Abdomen soft, non-tender, nondistended. Normal bowel sound. No organomegaly Central nervous system: Alert and oriented. No focal neurological deficits. Extremities: No cyanosis, clubbing or edema Skin: No rashes or ulcers Psychiatry:  Mood & affect appropriate.     Data Reviewed: I have personally reviewed following labs and imaging studies  CBC:  Recent Labs Lab 08/03/16 1543 08/03/16 1613 08/04/16 0942  WBC 13.7*  --  10.8*  NEUTROABS 10.7*  --   --   HGB 12.1* 12.6* 10.9*  HCT 36.6* 37.0* 34.1*  MCV 77.1*  --  76.8*  PLT 219  --  709   Basic Metabolic Panel:  Recent Labs Lab 08/03/16 1543 08/03/16 1613 08/03/16 1625 08/04/16 0942  NA 136 138  --  136  K 2.7* 2.6*  --  3.3*  CL 98* 99*  --  105  CO2 26  --   --  21*  GLUCOSE 132* 127*  --  112*  BUN 23* 22*  --  22*  CREATININE 1.94* 1.80*  --  1.68*  CALCIUM 8.6*  --   --  8.2*  MG  --   --  2.2  --    GFR: Estimated Creatinine Clearance: 52.7 mL/min (A) (by C-G formula based on SCr of 1.68 mg/dL (H)). Liver Function Tests:  Recent Labs Lab 08/03/16 1543  AST 18  ALT 12*  ALKPHOS 81  BILITOT 1.1  PROT 7.8  ALBUMIN 3.2*   No results for input(s): LIPASE, AMYLASE in the last 168 hours. No results for input(s): AMMONIA in the last 168 hours. Coagulation Profile:  Recent Labs Lab 08/03/16 1543  INR 1.05   Cardiac Enzymes:  Recent Labs Lab 08/03/16 2157 08/04/16 0338 08/04/16 0942  TROPONINI 0.11* 0.11* <0.03   BNP (last 3 results) No results for input(s): PROBNP in the last 8760 hours. HbA1C: No results for input(s): HGBA1C in the last 72 hours. CBG:  Recent Labs Lab 08/03/16 2320 08/04/16 0601 08/04/16 1118  GLUCAP  120* 114* 195*   Lipid Profile:  Recent Labs  08/04/16 0338  CHOL 123  HDL 29*  LDLCALC 70  TRIG 122  CHOLHDL 4.2   Thyroid Function Tests: No results for  input(s): TSH, T4TOTAL, FREET4, T3FREE, THYROIDAB in the last 72 hours. Anemia Panel: No results for input(s): VITAMINB12, FOLATE, FERRITIN, TIBC, IRON, RETICCTPCT in the last 72 hours. Urine analysis:    Component Value Date/Time   COLORURINE AMBER (A) 08/03/2016 1541   APPEARANCEUR HAZY (A) 08/03/2016 1541   LABSPEC 1.018 08/03/2016 1541   PHURINE 6.0 08/03/2016 1541   GLUCOSEU NEGATIVE 08/03/2016 1541   HGBUR SMALL (A) 08/03/2016 1541   BILIRUBINUR NEGATIVE 08/03/2016 1541   KETONESUR NEGATIVE 08/03/2016 1541   PROTEINUR >=300 (A) 08/03/2016 1541   NITRITE NEGATIVE 08/03/2016 1541   LEUKOCYTESUR MODERATE (A) 08/03/2016 1541   Sepsis Labs: _0 (procalcitonin:4,lacticidven:4) ) Recent Results (from the past 240 hour(s))  Blood culture (routine x 2)     Status: None (Preliminary result)   Collection Time: 08/03/16  5:50 PM  Result Value Ref Range Status   Specimen Description LEFT ANTECUBITAL  Final   Special Requests   Final    BOTTLES DRAWN AEROBIC AND ANAEROBIC Blood Culture adequate volume   Culture NO GROWTH < 24 HOURS  Final   Report Status PENDING  Incomplete  Blood culture (routine x 2)     Status: None (Preliminary result)   Collection Time: 08/03/16  5:50 PM  Result Value Ref Range Status   Specimen Description BLOOD LEFT HAND  Final   Special Requests   Final    BOTTLES DRAWN AEROBIC AND ANAEROBIC Blood Culture adequate volume   Culture NO GROWTH < 24 HOURS  Final   Report Status PENDING  Incomplete         Radiology Studies: Ct Angio Head W Or Wo Contrast  Result Date: 08/04/2016 CLINICAL DATA:  Dizziness and difficulty walking over the last 2 weeks. Acute infarctions in the left pons and middle cerebellar peduncle by MRI. EXAM: CT ANGIOGRAPHY HEAD AND NECK TECHNIQUE: Multidetector CT  imaging of the head and neck was performed using the standard protocol during bolus administration of intravenous contrast. Multiplanar CT image reconstructions and MIPs were obtained to evaluate the vascular anatomy. Carotid stenosis measurements (when applicable) are obtained utilizing NASCET criteria, using the distal internal carotid diameter as the denominator. CONTRAST:  50 cc Isovue 370 COMPARISON:  MRI 08/03/2016 FINDINGS: CT HEAD FINDINGS Brain: Acute infarctions in the left lateral pons and middle cerebellar peduncle are not specifically visible by CT. Extensive chronic small-vessel ischemic changes remain evident affecting the pons, thalami I, basal ganglia and throughout the hemispheric white matter. No sign of large vessel territory infarction. No mass lesion, hemorrhage, hydrocephalus or extra-axial collection. Vascular: There is atherosclerotic calcification of the major vessels at the base of the brain. 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 See results of previous MR report and recommendation for pituitary MRI. Orbits: Negative Review of the MIP images confirms the above findings CTA NECK FINDINGS Aortic arch: Aortic atherosclerosis.  No aneurysm or dissection. Right carotid system: Common carotid artery widely patent to the bifurcation. Carotid bifurcation shows minimal atherosclerotic change in the ICA bulb but no stenosis. Cervical ICA is tortuous but widely patent. Left carotid system: Common carotid artery widely patent to the bifurcation region. Mild atherosclerosis at the distal ICA bulb but no stenosis. Cervical ICA is tortuous but widely patent. Vertebral arteries: Right vertebral artery origin shows a small calcification but no stenosis. Right vertebral artery widely patent through the cervical region. No calcified plaque at the left vertebral artery origin. Question small soft tissue density defect which could simply be artifact because of  shoulder  density. Cannot rule out small soft plaque or thrombosis of the left vertebral artery origin. Beyond that, the left vertebral artery is widely patent through the cervical region. Skeleton: Mild degenerative spondylosis. 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 No canal stenosis. Other neck: No mass or lymphadenopathy. Upper chest: Mild apical pleural and parenchymal scarring. Review of the MIP images confirms the above findings CTA HEAD FINDINGS Anterior circulation: Both internal carotid arteries are widely patent to the skullbase. There is ordinary siphon atherosclerotic calcification peripherally. No stenosis greater than 50%. Anterior and middle cerebral vessels are patent without proximal stenosis, aneurysm or vascular malformation. Distal vessels show mild to moderate atherosclerotic irregularity. Posterior circulation: Both vertebral arteries are patent at the foramen magnum level. Mild atherosclerotic calcification but no stenosis. Basilar artery is patent, with mild atherosclerotic irregularity. Superior cerebellar and posterior cerebral arteries are widely patent. Venous sinuses: Patent and normal. Anatomic variants: None significant Delayed phase: No abnormal enhancement Review of the MIP images confirms the above findings IMPRESSION: Mild atherosclerotic disease at the carotid bifurcation regions but without stenosis. Cervical internal carotid artery is tortuous, consistent with the history of hypertension. Carotid siphon atherosclerosis but no stenosis greater than 50%. No intracranial anterior circulation correctable proximal stenosis. Distal vessel atherosclerotic irregularity. Abnormal appearance of the proximal left vertebral artery by CTA. I am not certain if this is due to artifact from shoulder density or if there is a small plaque or thrombus at the left vertebral artery origin. The vessel appears patent beyond that. I would favor this is artifactual, but cannot state that with certainty. If further  imaging is desired, MR angiography with contrast could evaluate that location. Atherosclerotic irregularity of the vertebral artery but without flow limiting stenosis. Re- demonstration of sellar enlargement, apparently with a pituitary mass. See previous MRI recommendation. Electronically Signed   By: Nelson Chimes M.D.   On: 08/04/2016 09:40   Dg Chest 2 View  Result Date: 08/03/2016 CLINICAL DATA:  Transient ischemic attack. EXAM: CHEST  2 VIEW COMPARISON:  Radiograph 07/17/2016 FINDINGS: Low lung volumes accentuating cardiac size and causing bronchovascular crowding. Mild bibasilar atelectasis. No confluent airspace disease, pleural effusion or pneumothorax. Stable osseous structures. IMPRESSION: Lung volumes accentuating the cardiac size and causing bronchovascular crowding. Electronically Signed   By: Jeb Levering M.D.   On: 08/03/2016 23:12   Ct Angio Neck W Or Wo Contrast  Result Date: 08/04/2016 CLINICAL DATA:  Dizziness and difficulty walking over the last 2 weeks. Acute infarctions in the left pons and middle cerebellar peduncle by MRI. EXAM: CT ANGIOGRAPHY HEAD AND NECK TECHNIQUE: Multidetector CT imaging of the head and neck was performed using the standard protocol during bolus administration of intravenous contrast. Multiplanar CT image reconstructions and MIPs were obtained to evaluate the vascular anatomy. Carotid stenosis measurements (when applicable) are obtained utilizing NASCET criteria, using the distal internal carotid diameter as the denominator. CONTRAST:  50 cc Isovue 370 COMPARISON:  MRI 08/03/2016 FINDINGS: CT HEAD FINDINGS Brain: Acute infarctions in the left lateral pons and middle cerebellar peduncle are not specifically visible by CT. Extensive chronic small-vessel ischemic changes remain evident affecting the pons, thalami I, basal ganglia and throughout the hemispheric white matter. No sign of large vessel territory infarction. No mass lesion, hemorrhage, hydrocephalus or  extra-axial collection. Vascular: There is atherosclerotic calcification of the major vessels at the base of the brain. 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 See results of previous MR report and recommendation for pituitary MRI.  Orbits: Negative Review of the MIP images confirms the above findings CTA NECK FINDINGS Aortic arch: Aortic atherosclerosis.  No aneurysm or dissection. Right carotid system: Common carotid artery widely patent to the bifurcation. Carotid bifurcation shows minimal atherosclerotic change in the ICA bulb but no stenosis. Cervical ICA is tortuous but widely patent. Left carotid system: Common carotid artery widely patent to the bifurcation region. Mild atherosclerosis at the distal ICA bulb but no stenosis. Cervical ICA is tortuous but widely patent. Vertebral arteries: Right vertebral artery origin shows a small calcification but no stenosis. Right vertebral artery widely patent through the cervical region. No calcified plaque at the left vertebral artery origin. Question small soft tissue density defect which could simply be artifact because of shoulder density. Cannot rule out small soft plaque or thrombosis of the left vertebral artery origin. Beyond that, the left vertebral artery is widely patent through the cervical region. Skeleton: Mild degenerative spondylosis. 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 No canal stenosis. Other neck: No mass or lymphadenopathy. Upper chest: Mild apical pleural and parenchymal scarring. Review of the MIP images confirms the above findings CTA HEAD FINDINGS Anterior circulation: Both internal carotid arteries are widely patent to the skullbase. There is ordinary siphon atherosclerotic calcification peripherally. No stenosis greater than 50%. Anterior and middle cerebral vessels are patent without proximal stenosis, aneurysm or vascular malformation. Distal vessels show mild to moderate atherosclerotic irregularity. Posterior  circulation: Both vertebral arteries are patent at the foramen magnum level. Mild atherosclerotic calcification but no stenosis. Basilar artery is patent, with mild atherosclerotic irregularity. Superior cerebellar and posterior cerebral arteries are widely patent. Venous sinuses: Patent and normal. Anatomic variants: None significant Delayed phase: No abnormal enhancement Review of the MIP images confirms the above findings IMPRESSION: Mild atherosclerotic disease at the carotid bifurcation regions but without stenosis. Cervical internal carotid artery is tortuous, consistent with the history of hypertension. Carotid siphon atherosclerosis but no stenosis greater than 50%. No intracranial anterior circulation correctable proximal stenosis. Distal vessel atherosclerotic irregularity. Abnormal appearance of the proximal left vertebral artery by CTA. I am not certain if this is due to artifact from shoulder density or if there is a small plaque or thrombus at the left vertebral artery origin. The vessel appears patent beyond that. I would favor this is artifactual, but cannot state that with certainty. If further imaging is desired, MR angiography with contrast could evaluate that location. Atherosclerotic irregularity of the vertebral artery but without flow limiting stenosis. Re- demonstration of sellar enlargement, apparently with a pituitary mass. See previous MRI recommendation. Electronically Signed   By: Nelson Chimes M.D.   On: 08/04/2016 09:40   Mr Jodene Nam Head Wo Contrast  Result Date: 08/03/2016 CLINICAL DATA:  Altered mental status and weakness for 2 days. Recent fall. History of hypertension and diabetes. Assess for stroke. EXAM: MRI HEAD WITHOUT CONTRAST MRA HEAD WITHOUT CONTRAST TECHNIQUE: Multiplanar, multiecho pulse sequences of the brain and surrounding structures were obtained without intravenous contrast. Angiographic images of the head were obtained using MRA technique without contrast. COMPARISON:   None. FINDINGS: MRI HEAD FINDINGS- mildly motion degraded examination. BRAIN: Patchy reduced diffusion LEFT brachium pontis with low ADC values. No susceptibility artifact to suggest hemorrhage. Faint susceptibility artifact associated with old RIGHT basal ganglia infarct. Old bilateral thalamus and bilateral basal ganglia lacunar infarcts. Patchy to confluent supratentorial white matter FLAIR T2 hyperintensities. Old LEFT cerebellum and pontine lacunar infarcts. Ventricles and sulci are normal for patient's age. No midline shift, mass effect or masses. No  abnormal extra-axial fluid collections. VASCULAR: Major intracranial vascular flow voids present at skull base. SKULL AND UPPER CERVICAL SPINE: Expanded sella with heterogeneous possible mass along the inferior margin. No suspicious calvarial bone marrow signal. Craniocervical junction maintained. SINUSES/ORBITS: Trace paranasal sinus mucosal thickening. Mastoid air cells are well aerated. The included ocular globes and orbital contents are non-suspicious. OTHER: None. MRA HEAD FINDINGS- severe motion through circle of Willis. ANTERIOR CIRCULATION: Normal flow related enhancement of the included cervical, petrous, cavernous and supraclinoid internal carotid arteries. Patent anterior communicating artery. Normal flow related enhancement of the anterior and middle cerebral arteries, including distal segments. Signal dropout through proximal M2 segments due to motion. No large vessel occlusion or aneurysm. POSTERIOR CIRCULATION: RIGHT vertebral artery is dominant. Basilar artery is patent, with normal flow related enhancement of the main branch vessels. Flow related enhancement bilateral posterior cerebral artery's though, signal loss due to motion. No large vessel occlusion or aneurysm. 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 Source images and MIP images were reviewed. IMPRESSION: MRI HEAD: 1. Acute LEFT brachium  pontis infarcts. 2. 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 3. Moderate to severe chronic small vessel ischemic disease. 4. 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 MRA HEAD: 1. Limited examination: Severe motion through circle of Willis. 2. No emergent large vessel occlusion. Electronically Signed   By: Elon Alas M.D.   On: 08/03/2016 17:31   Mr Brain Wo Contrast  Result Date: 08/03/2016 CLINICAL DATA:  Altered mental status and weakness for 2 days. Recent fall. History of hypertension and diabetes. Assess for stroke. EXAM: MRI HEAD WITHOUT CONTRAST MRA HEAD WITHOUT CONTRAST TECHNIQUE: Multiplanar, multiecho pulse sequences of the brain and surrounding structures were obtained without intravenous contrast. Angiographic images of the head were obtained using MRA technique without contrast. COMPARISON:  None. FINDINGS: MRI HEAD FINDINGS- mildly motion degraded examination. BRAIN: Patchy reduced diffusion LEFT brachium pontis with low ADC values. No susceptibility artifact to suggest hemorrhage. Faint susceptibility artifact associated with old RIGHT basal ganglia infarct. Old bilateral thalamus and bilateral basal ganglia lacunar infarcts. Patchy to confluent supratentorial white matter FLAIR T2 hyperintensities. Old LEFT cerebellum and pontine lacunar infarcts. Ventricles and sulci are normal for patient's age. No midline shift, mass effect or masses. No abnormal extra-axial fluid collections. VASCULAR: Major intracranial vascular flow voids present at skull base. SKULL AND UPPER CERVICAL SPINE: Expanded sella with heterogeneous possible mass along the inferior margin. No suspicious calvarial bone marrow signal. Craniocervical junction maintained. SINUSES/ORBITS: Trace paranasal sinus mucosal thickening. Mastoid air cells are well aerated. The included ocular globes and orbital contents are non-suspicious. OTHER:  None. MRA HEAD FINDINGS- severe motion through circle of Willis. ANTERIOR CIRCULATION: Normal flow related enhancement of the included cervical, petrous, cavernous and supraclinoid internal carotid arteries. Patent anterior communicating artery. Normal flow related enhancement of the anterior and middle cerebral arteries, including distal segments. Signal dropout through proximal M2 segments due to motion. No large vessel occlusion or aneurysm. POSTERIOR CIRCULATION: RIGHT vertebral artery is dominant. Basilar artery is patent, with normal flow related enhancement of the main branch vessels. Flow related enhancement bilateral posterior cerebral artery's though, signal loss due to motion. No large vessel occlusion or aneurysm. 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 Source images and MIP images were reviewed. IMPRESSION: MRI HEAD: 1. Acute LEFT brachium pontis infarcts. 2. 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  3. Moderate to severe chronic small vessel ischemic disease. 4. 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 MRA HEAD: 1. Limited examination: Severe motion through circle of Willis. 2. No emergent large vessel occlusion. Electronically Signed   By: Elon Alas M.D.   On: 08/03/2016 17:31      Scheduled Meds: . atorvastatin  40 mg Oral q1800  . doxazosin  8 mg Oral QHS  . enoxaparin (LOVENOX) injection  40 mg Subcutaneous Q24H  . insulin aspart  0-15 Units Subcutaneous TID WC   Continuous Infusions:   LOS: 1 day    Time spent in minutes: 35    Debbe Odea, MD [facility] Pager: [email or web address] Password [email or web address] 08/04/2016, 3:56 PM

## 2016-08-04 NOTE — Evaluation (Addendum)
Physical Therapy Evaluation Patient Details Name: Blake Moran MRN: [id number] DOB: 1950/02/06 Today's Date: 08/04/2016   History of Present Illness  66 y.o. 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   Clinical Impression  Pt is unsteady on his feet with significant visual dysfunction making him a high fall risk.  He is equally strong in both legs, with some mild coordination deficits.  He is limited on his feet by increased dizziness with movement and does better with the RW, but PTA did not use an assistive device.  He would benefit from intensive post acute rehab at the inpatient rehab setting to increase his independence and safety for return home.   PT to follow acutely for deficits listed below.       Follow Up Recommendations CIR    Equipment Recommendations  Rolling walker with 5" wheels    Recommendations for Other Services Rehab consult     Precautions / Restrictions Precautions Precautions: Fall Precaution Comments: pt had a recent fall forward when reaching down to the floor.       Mobility  Bed Mobility Overal bed mobility: Needs Assistance Bed Mobility: Supine to Sit;Sit to Supine;Rolling Rolling: Modified independent (Device/Increase time) (use of rail)   Supine to sit: Supervision Sit to supine: Supervision   General bed mobility comments: supervision for safety during transitions, using bed rail to assist with control.   Transfers Overall transfer level: Needs assistance Equipment used: Rolling walker (2 wheeled);2 person hand held assist Transfers: Sit to/from Stand Sit to Stand: Min assist         General transfer comment: Min assist to support trunk during transitions, verbal cues for safe hand placement.  Tried both with and without RW both  requiring min assist.   Ambulation/Gait Ambulation/Gait assistance: Mod assist;Min assist Ambulation Distance (Feet): 15 Feet (x2) Assistive device: Rolling walker (2 wheeled);2 person hand held assist Gait Pattern/deviations: Step-through pattern;Staggering right;Drifts right/left;Narrow base of support     General Gait Details: Pt with narrow base, staggering gait pattern without obvious ataxia, increased dizziness when up and moving around.  Pt did better with RW (min assist vs mod assist with two person hand held assit), however, he did not use a RW at baseline.       Balance Overall balance assessment: Needs assistance Sitting-balance support: No upper extremity supported;Feet supported;Bilateral upper extremity supported;Single extremity supported Sitting balance-Leahy Scale: Fair     Standing balance support: Bilateral upper extremity supported;No upper extremity supported;Single extremity supported Standing balance-Leahy Scale: Poor Standing balance comment: need min assist in static standing for balance (+) sway which increases with eyes closed and then his trunk drifts left with eye closed as well.                              Pertinent Vitals/Pain Pain Assessment: No/denies pain    Home Living Family/patient expects to be discharged to:: Private residence Living Arrangements: Other relatives Available Help at Discharge: Family;Available 24 hours/day Type of Home: House Home Access: Stairs to enter Entrance Stairs-Rails: Right Entrance Stairs-Number of Steps: 6 Home Layout: Multi-level (split level; lives on lower floor) Home Equipment: None Additional Comments: Also has a lady "friend" and a daughter present during the evaluation.  Prior Function Level of Independence: Independent         Comments: drives/loads trucks full time     Hand Dominance   Dominant Hand: Right    Extremity/Trunk Assessment   Upper Extremity Assessment Upper  Extremity Assessment: Defer to OT evaluation LUE Deficits / Details: feels LUE is more "uncoordinated"    Lower Extremity Assessment Lower Extremity Assessment: RLE deficits/detail;LLE deficits/detail RLE Deficits / Details: 4+/5 strength especially at knee flex/ext, ankle DF WNL and hip flexion WNL RLE Sensation:  (intact to LT) RLE Coordination: decreased gross motor (mildly tested heel to shin in sitting) LLE Deficits / Details: 4+/5, but equal to right leg at knee flex/ext, ankle DF WNL and hip flexion WNL.  LLE Sensation:  (decreased to LT in S1 dermatome) LLE Coordination: decreased gross motor;decreased fine motor    Cervical / Trunk Assessment Cervical / Trunk Assessment: Other exceptions Cervical / Trunk Exceptions: difficulty maintaining midline postural control at times and (+) sway when trunk not supported by back rest  Communication   Communication: No difficulties  Cognition Arousal/Alertness: Awake/alert Behavior During Therapy: WFL for tasks assessed/performed Overall Cognitive Status: Impaired/Different from baseline Area of Impairment: Problem solving                             Problem Solving: Slow processing General Comments: Pt slow to answer, but when he does answer, it is accurate.        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  Dix hallpike negative bil, horizontal canal testing negative bil.  Right beating nystagmus with right gaze and left visual field double vision consistant with interneucular opthalmoplegia (INO)--see OT note for detailed visual assessment.         Assessment/Plan    PT Assessment Patient needs continued PT services  PT Problem List Decreased strength;Decreased activity tolerance;Decreased balance;Decreased  mobility;Decreased coordination;Decreased cognition;Decreased knowledge of use of DME;Decreased safety awareness;Decreased knowledge of precautions;Impaired sensation       PT Treatment Interventions DME instruction;Gait training;Stair training;Functional mobility training;Therapeutic activities;Therapeutic exercise;Balance training;Neuromuscular re-education;Cognitive remediation;Patient/family education    PT Goals (Current goals can be found in the Care Plan section)  Acute Rehab PT Goals Patient Stated Goal: to get better PT Goal Formulation: With patient Time For Goal Achievement: 08/18/16 Potential to Achieve Goals: Good    Frequency Min 4X/week           AM-PAC PT "6 Clicks" Daily Activity  Outcome Measure Difficulty turning over in bed (including adjusting bedclothes, sheets and blankets)?: None Difficulty moving from lying on back to sitting on the side of the bed? : None Difficulty sitting down on and standing up from a chair with arms (e.g., wheelchair, bedside commode, etc,.)?: Total Help needed moving to and from a bed to chair (including a wheelchair)?: A Little Help needed walking in hospital room?: A Lot Help needed climbing 3-5 steps with a railing? : A Lot 6 Click Score: 16    End of Session Equipment Utilized During Treatment: Gait belt Activity Tolerance: Other (comment) (limited by dizziness. ) Patient left: in bed;with call bell/phone within reach;with bed alarm set;with family/visitor present   PT Visit Diagnosis: Unsteadiness on feet (R26.81);Other symptoms and signs involving the nervous system (R29.898)  Time: 7092-9574 PT Time Calculation (min) (ACUTE ONLY): 35 min   Charges:       Wells Guiles B. Mylena Sedberry, PT, DPT [phone]   PT Evaluation $PT Eval Moderate Complexity: 1 Procedure PT Treatments $Gait Training: 8-22 mins   08/04/2016, 5:33 PM

## 2016-08-04 NOTE — Progress Notes (Signed)
CSW met with patient and family at bedside to discuss discharge planning. CSW explained SNF placement process and role in discharge planning. Patient and family indicated that patient would not be interested in SNF placement at this time. Patient expressed that PT had discussed inpatient rehab, and the patient would like to pursue that. Patient requested home health services if CIR doesn't happen.   CSW alerted RNCM. CSW signing off.  Laveda Abbe, [place] Clinical Social Worker [phone]

## 2016-08-04 NOTE — Evaluation (Signed)
Speech Language Pathology Evaluation Patient Details Name: Blake Moran MRN: [id number] DOB: [birth year] Today's Date: 08/04/2016 Time: 7893-8101 SLP Time Calculation (min) (ACUTE ONLY): 25 min  Problem List:  Patient Active Problem List   Diagnosis Date Noted  . CVA (cerebral vascular accident) ([place]) 08/03/2016  . Hypokalemia 08/03/2016  . CKD (chronic kidney disease), stage III 08/03/2016  . Diabetes mellitus type 2 in obese ([place]) 08/03/2016  . 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 Essential hypertension 08/03/2016   Past Medical History:  Past Medical History:  Diagnosis Date  . BPH (benign prostatic hyperplasia)   . CKD (chronic kidney disease), stage III   . Diabetes mellitus without complication ([place])   . Hypertension   . Vitamin D deficiency    Past Surgical History:  Past Surgical History:  Procedure Laterality Date  . NO PAST SURGERIES     HPI:  66 yo male adm to [facility] diagnosed with TIA.  PMH + for HTN, DM, BPH, Vit D deficiency, smoker.  Pt resides with his aunt/uncle and reports he manages his own medications/bills but his aunt/uncle do home responsibilities.  Pt found to have a small left pontine cva per MRI  Old cerebellar, pontinue, thalamic,  basal ganglia CVA.      Assessment / Plan / Recommendation Clinical Impression  Language - expressive and receptive intact with no dysarthria.  MOCA administered with pt scoring 16/30 - (moderately cognitively impaired per [facility] guidelines).  Pt strength included naming, abstraction and recall with use of category/multiple choice cue.  He states testing was as easy as would be a baseline and no family present.   Pt has an 8th grade education and lives with his aunt/uncle who complete all home duties per pt.  Of note, pt was distracted during testing by venipuncture drawing blood, which may have furhter worsened his scoring.  Educated pt to compensation strategies for memory, especially  as he manages his own medications, appointments, and drives.  Using teach back reinforced reasoning for recommendations.  Suspect pt is at baseline.     SLP Assessment  SLP Recommendation/Assessment: Patient does not need any further Speech Lanaguage Pathology Services SLP Visit Diagnosis: Cognitive communication deficit (R41.841)    Follow Up Recommendations       Frequency and Duration           SLP Evaluation Cognition  Overall Cognitive Status: No family/caregiver present to determine baseline cognitive functioning Arousal/Alertness: Awake/alert Orientation Level: Oriented to person;Oriented to place;Oriented to time;Oriented to situation (time x specific date) Attention: Sustained;Selective Sustained Attention: Appears intact Selective Attention: Impaired Memory: Impaired Memory Impairment: Retrieval deficit Awareness: Appears intact Problem Solving: Appears intact       Comprehension  Auditory Comprehension Overall Auditory Comprehension: Appears within functional limits for tasks assessed Yes/No Questions: Not tested Commands: Within Functional Limits Visual Recognition/Discrimination Discrimination: Within Function Limits Reading Comprehension Reading Status: Within funtional limits (for reading clock)    Expression Expression Primary Mode of Expression: Verbal Verbal Expression Overall Verbal Expression: Appears within functional limits for tasks assessed Initiation: No impairment Repetition: Impaired Level of Impairment: Sentence level (at complex sentence level, suspect due to decreased working memory, attention) Naming: No impairment Pragmatics: No impairment Written Expression Dominant Hand: Right Written Expression: Not tested (pt drew clock)   Oral / Motor  Oral Motor/Sensory Function Overall Oral Motor/Sensory Function: Within functional limits Motor Speech Overall Motor Speech: Appears within functional limits for tasks assessed Respiration:  Within functional limits  Resonance: Within functional limits Articulation: Within functional limitis Intelligibility: Intelligible Motor Planning: Witnin functional limits Motor Speech Errors: Not applicable   [place], [facility] SLP [phone]

## 2016-08-04 NOTE — Progress Notes (Signed)
Nutrition Brief Note  RD consulted regarding CVA.  Wt Readings from Last 15 Encounters:  08/03/16 235 lb 12.8 oz (107 kg)    Body mass index is 35.85 kg/m. Patient meets criteria for class II obesity based on current BMI.   Current diet order is heart health/carbohydrate modified, patient is consuming approximately 100% of meals at this time. Family at bedside reports they usual prepare and consume home cooked meals rather than going out to eat. Labs and medications reviewed.   No nutrition interventions warranted at this time. If nutrition issues arise, please consult RD.   Corrin Parker, MS, RD, LDN Pager # [phone] After hours/ weekend pager # [phone]

## 2016-08-04 NOTE — Progress Notes (Signed)
Pt is requesting food. Pt has passed swallow test. RN has page notified MD to get diet orders. Awaiting orders.

## 2016-08-04 NOTE — Progress Notes (Addendum)
STROKE TEAM PROGRESS NOTE   HISTORY OF PRESENT ILLNESS (per record) Blake Moran is an 66 y.o. male with a history of hypertension, type II diabetes mellitus, stage III CKD, and vitamin D deficiency who presented with two weeks of waxing and waning vertigo, staggering gait, and intermittently blurred vision.  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  The patient is a current smoker.   LKN: 2 weeks ago.  Patient was not administered IV t-PA secondary to subacute symptoms/arriving outside of the treatment window.  He was admitted to General Neurology for further evaluation and treatment.   SUBJECTIVE (INTERVAL HISTORY) His daughter is at the bedside.  The patient is alert, oriented, and follows all commands appropriately. Stated that when he sits up he feels dizzy and imbalance on walking. He is willing to quit smoking.    OBJECTIVE Temp:  [97.4 F (36.3 C)-100.3 F (37.9 C)] 98.8 F (37.1 C) (07/03 0737) Pulse Rate:  [78-89] 87 (07/03 0537) Cardiac Rhythm: Normal sinus rhythm;Heart block (07/03 0700) Resp:  [12-22] 13 (07/03 0537) BP: (104-137)/(66-80) 104/67 (07/03 0737) SpO2:  [93 %-100 %] 96 % (07/03 0737) Weight:  [107 kg (235 lb 12.8 oz)-108 kg (238 lb)] 107 kg (235 lb 12.8 oz) (07/02 2154)  CBC:  Recent Labs Lab 08/03/16 1543 08/03/16 1613  WBC 13.7*  --   NEUTROABS 10.7*  --   HGB 12.1* 12.6*  HCT 36.6* 37.0*  MCV 77.1*  --   PLT 219  --     Basic Metabolic Panel:  Recent Labs Lab 08/03/16 1543 08/03/16 1613 08/03/16 1625  NA 136 138  --   K 2.7* 2.6*  --   CL 98* 99*  --   CO2 26  --   --   GLUCOSE 132* 127*  --   BUN 23* 22*  --   CREATININE 1.94* 1.80*  --   CALCIUM 8.6*  --   --   MG  --   --  2.2    Lipid Panel:    Component Value Date/Time   CHOL 123 08/04/2016 0338   TRIG 122 08/04/2016 0338   HDL 29  (L) 08/04/2016 0338   CHOLHDL 4.2 08/04/2016 0338   VLDL 24 08/04/2016 0338   LDLCALC 70 08/04/2016 0338   HgbA1c: No results found for: HGBA1C Urine Drug Screen:    Component Value Date/Time   LABOPIA NONE DETECTED 08/03/2016 1541   COCAINSCRNUR NONE DETECTED 08/03/2016 1541   LABBENZ NONE DETECTED 08/03/2016 1541   AMPHETMU NONE DETECTED 08/03/2016 1541   THCU NONE DETECTED 08/03/2016 1541   LABBARB NONE DETECTED 08/03/2016 1541    Alcohol Level     Component Value Date/Time   ETH <5 08/03/2016 1543    IMAGING I have personally reviewed the radiological images below and agree with the radiology interpretations.  Ct Angio Head and neck W and Wo Contrast 08/04/2016 IMPRESSION: Mild atherosclerotic disease at the carotid bifurcation regions but without stenosis. Cervical internal carotid artery is tortuous, consistent with the history of hypertension. Carotid siphon atherosclerosis but no stenosis greater than 50%. No intracranial anterior circulation correctable proximal stenosis. Distal vessel atherosclerotic irregularity. Abnormal appearance of the proximal left vertebral artery by CTA. I am not certain if this is due to artifact from shoulder density or if there is a small plaque or thrombus at the left vertebral artery origin.  The vessel appears patent beyond that. I would favor this is artifactual, but cannot state that with certainty. If further imaging is desired, MR angiography with contrast could evaluate that location. Atherosclerotic irregularity of the vertebral artery but without flow limiting stenosis. Re- demonstration of sellar enlargement, apparently with a pituitary mass.  Dg Chest 2 View 08/03/2016 IMPRESSION: Lung volumes accentuating the cardiac size and causing bronchovascular crowding.  Mr Jodene Nam Head Wo Contrast 08/03/2016 IMPRESSION: MRI HEAD: 1. Acute LEFT brachium pontis infarcts. 2. 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 3. Moderate to  severe chronic small vessel ischemic disease. 4. 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 MRA HEAD: 1. Limited examination: Severe motion through circle of Willis. 2. No emergent large vessel occlusion.   TTE  - LVEF 60-65%, moderate LVH, normal wall motion, grade 1 DD,   indeterminate LV filling pressure, mild aortic stenosis - AVA   around 2.0 cm2, MAC with trivial MR, normal LA size, normal IVC.    PHYSICAL EXAM  Temp:  [97.4 F (36.3 C)-99.9 F (37.7 C)] 98.3 F (36.8 C) (07/03 1600) Pulse Rate:  [77-89] 77 (07/03 1600) Resp:  [12-22] 16 (07/03 1600) BP: (104-135)/(66-82) 135/82 (07/03 1600) SpO2:  [93 %-100 %] 99 % (07/03 1600) Weight:  [235 lb 12.8 oz (107 kg)] 235 lb 12.8 oz (107 kg) (07/02 2154)  General - Well nourished, well developed, in no apparent distress.  Ophthalmologic - Sharp disc margins OU.   Cardiovascular - Regular rate and rhythm.  Mental Status -  Level of arousal and orientation to time, place, and person were intact. Language including expression, naming, repetition, comprehension was assessed and found intact. Fund of Knowledge was assessed and was intact  Cranial Nerves II - XII - II - Visual field intact OU. III, IV, VI - Extraocular movements intact. V - Facial sensation intact bilaterally. VII - Facial movement intact bilaterally. VIII - Hearing & vestibular intact bilaterally. X - Palate elevates symmetrically. XI - Chin turning & shoulder shrug intact bilaterally. XII - Tongue protrusion intact.  Motor Strength - The patient's strength was normal in all extremities and pronator drift was absent.  Bulk was normal and fasciculations were absent.   Motor Tone - Muscle tone was assessed at the neck and appendages and was normal.  Reflexes - The patient's reflexes were 1+ in all extremities and he had no pathological reflexes.  Sensory - Light touch, temperature/pinprick were  assessed and were symmetrical.    Coordination - The patient had normal movements in the hands and feet with no ataxia or dysmetria.  Tremor was absent.  Gait and Station - deferred.   ASSESSMENT/PLAN Blake Moran is a 66 y.o. male with history of hypertension, type II diabetes mellitus, stage III CKD, and vitamin D deficiency who presented with two weeks of waxing and waning vertiginous dizziness, staggering gait, and intermittently blurred vision.  He did not receive IV t-PA due to subacute symptoms/presenting outside of the treatment window.   Stroke: Acute left brachium pontis infarcts, likely due to small vessel disease in the setting of moderate to severe chronic small vessel disease.  Resultant  Dizzy when sits up  CT head: not ordered  MRI head:  Acute LEFT brachium pontis infarcts. 2. 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    MRA head: No emergent large vessel occlusion.   CTA head and neck - mild diffuse atherosclerosis  2D Echo: EF 60-65%.  No source of embolus.  Grade I diastolic dysfunction.  Mildly calcified aortic and mitral valves.    TTE EF 60-65%  LDL 70  HgbA1c pending  Lovenox 40 mg sq daily for VTE prophylaxis  Diet heart healthy/carb modified Room service appropriate? Yes; Fluid consistency: Thin  aspirin 81 mg daily prior to admission, now on aspirin 325 mg daily. Continue ASA on discharge.  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  No visual field deficit seen   Denies HA  MRI pituitary protocol pending  Hypertension  Stable  Permissive hypertension (OK if < 220/120) but gradually normalize in 5-7  days  Long-term BP goal normotensive  Hyperlipidemia  Home meds:  noneal  LDL 70, goal < 70  Add atorvastatin 74m PO daily  Continue statin at discharge  Diabetes  HgbA1c pending, goal < 7.0  Controlled  SSI  Tobacco abuse  Current smoker  Smoking cessation counseling provided  Pt is willing to quit  Other Stroke Risk Factors  Advanced age  Obesity, Body mass index is 35.85 kg/m., recommend weight loss, diet and exercise as appropriate   Hx stroke/TIA - by imaging  Other Active Problems  [facility]  JRosalin Hawking MD PhD Stroke Neurology 08/04/2016 5:32 PM   To contact Stroke Continuity provider, please refer to [email or web address] After hours, contact General Neurology

## 2016-08-04 NOTE — Consult Note (Signed)
Requesting Physician: Dr. Lorin Mercy    Chief Complaint: Dizziness   History obtained from:    Patient and Chart     HPI:                                                                                                                                         Blake Moran is an 66 y.o. male  66 y.o. male with medical history significant of HTN, DM, BPH, and vitamin D deficiency who has been complaining of dizziness for the ;ast 2 weeks, symptoms has been waxing and weaning but it was getting worse today that pt decided to seek medical attention. Pt reported trouble walking as well and reported a fall 2 weeks ago. He denies any hearing loss, no visual changes, no double vision,  no N/V MRI brain was done and showed very small L pontine stroke   Date last known well: 2 weeks ago  tPA Given: no, symptoms are subacute             Past Medical History:  Diagnosis Date  . BPH (benign prostatic hyperplasia)   . Diabetes mellitus without complication ([place])   . Hypertension   . Vitamin D deficiency     History reviewed. No pertinent surgical history.  Family History  Problem Relation Age of Onset  . Cancer Mother 19  . CAD Father 29  . Stroke Neg Hx    Social History:  reports that he has been smoking.  He has a 22.00 pack-year smoking history. He has never used smokeless tobacco. He reports that he does not drink alcohol or use drugs.  Allergies:  Allergies  Allergen Reactions  . Lisinopril Swelling    Medications:                                                                                                                           I have reviewed the patient's current medications.  ROS:  History obtained from patient   General ROS: negative for - chills, fatigue, fever, night sweats, weight gain or weight loss Psychological ROS: negative for  - behavioral disorder, hallucinations, memory difficulties, mood swings or suicidal ideation Ophthalmic ROS: negative for - blurry vision, double vision, eye pain or loss of vision ENT ROS: negative for - epistaxis, nasal discharge, oral lesions, sore throat, tinnitus or vertigo Allergy and Immunology ROS: negative for - hives or itchy/watery eyes Hematological and Lymphatic ROS: negative for - bleeding problems, bruising or swollen lymph nodes Endocrine ROS: negative for - galactorrhea, hair pattern changes, polydipsia/polyuria or temperature intolerance Respiratory ROS: negative for - cough, hemoptysis, shortness of breath or wheezing Cardiovascular ROS: negative for - chest pain, dyspnea on exertion, edema or irregular heartbeat Gastrointestinal ROS: negative for - abdominal pain, diarrhea, hematemesis, nausea/vomiting or stool incontinence Genito-Urinary ROS: negative for - dysuria, hematuria, incontinence or urinary frequency/urgency Musculoskeletal ROS: negative for - joint swelling or muscular weakness Neurological ROS: as noted in HPI Dermatological ROS: negative for rash and skin lesion changes  Neurologic Examination:                                                                                                      Blood pressure 130/75, pulse 85, temperature 99.9 F (37.7 C), temperature source Oral, resp. rate 16, height 5\' 8"  (1.727 m), weight 107 kg (235 lb 12.8 oz), SpO2 99 %.  HEENT-  Normocephalic, no lesions, without obvious abnormality.  Normal external eye and conjunctiva.  Normal TM's bilaterally.  Normal auditory canals and external ears. Normal external nose, mucus membranes and septum.  Normal pharynx. Cardiovascular- RRR, S1S2 Lungs- {CTA  Abdomen-soft NT ND  Mental Status:NIHSS 0 Alert, oriented, thought content appropriate.  Speech fluent without evidence of aphasia.  Able to follow 3 step commands without difficulty. Cranial Nerves: II: Discs flat bilaterally;  Visual fields grossly normal,  III,IV, VI: ptosis not present, extra-ocular motions intact bilaterally, pupils equal, round, reactive to light and accommodation V,VII: smile symmetric, facial light touch sensation normal bilaterally VIII: hearing normal bilaterally IX,X: uvula rises symmetrically XI: bilateral shoulder shrug XII: midline tongue extension Motor: Right : Upper extremity   5/5    Left:     Upper extremity   5/5  Lower extremity   5/5     Lower extremity   5/5 Tone and bulk:normal tone throughout; no atrophy noted Sensory: Pinprick and light touch intact throughout, bilaterally Deep Tendon Reflexes: 2+ and symmetric throughout Plantars: Right: downgoing   Left: downgoing Cerebellar: normal finger-to-nose, normal rapid alternating movements and normal heel-to-shin test Gait: normal gait and station       Lab Results: Basic Metabolic Panel:  Recent Labs Lab 08/03/16 1543 08/03/16 1613 08/03/16 1625  NA 136 138  --   K 2.7* 2.6*  --   CL 98* 99*  --   CO2 26  --   --   GLUCOSE 132* 127*  --   BUN 23* 22*  --   CREATININE 1.94* 1.80*  --   CALCIUM 8.6*  --   --  MG  --   --  2.2    Liver Function Tests:  Recent Labs Lab 08/03/16 1543  AST 18  ALT 12*  ALKPHOS 81  BILITOT 1.1  PROT 7.8  ALBUMIN 3.2*   No results for input(s): LIPASE, AMYLASE in the last 168 hours. No results for input(s): AMMONIA in the last 168 hours.  CBC:  Recent Labs Lab 08/03/16 1543 08/03/16 1613  WBC 13.7*  --   NEUTROABS 10.7*  --   HGB 12.1* 12.6*  HCT 36.6* 37.0*  MCV 77.1*  --   PLT 219  --     Cardiac Enzymes:  Recent Labs Lab 08/03/16 2157  TROPONINI 0.11*    Lipid Panel: No results for input(s): CHOL, TRIG, HDL, CHOLHDL, VLDL, LDLCALC in the last 168 hours.  CBG:  Recent Labs Lab 08/03/16 2320  GLUCAP 120*    Microbiology: Results for orders placed or performed during the hospital encounter of 08/03/16  Blood culture (routine x 2)      Status: None (Preliminary result)   Collection Time: 08/03/16  5:50 PM  Result Value Ref Range Status   Specimen Description LEFT ANTECUBITAL  Final   Special Requests   Final    BOTTLES DRAWN AEROBIC AND ANAEROBIC Blood Culture adequate volume   Culture PENDING  Incomplete   Report Status PENDING  Incomplete  Blood culture (routine x 2)     Status: None (Preliminary result)   Collection Time: 08/03/16  5:50 PM  Result Value Ref Range Status   Specimen Description BLOOD LEFT HAND  Final   Special Requests   Final    BOTTLES DRAWN AEROBIC AND ANAEROBIC Blood Culture adequate volume   Culture PENDING  Incomplete   Report Status PENDING  Incomplete    Coagulation Studies:  Recent Labs  08/03/16 1543  LABPROT 13.7  INR 1.05    Imaging: Dg Chest 2 View  Result Date: 08/03/2016 CLINICAL DATA:  Transient ischemic attack. EXAM: CHEST  2 VIEW COMPARISON:  Radiograph 07/17/2016 FINDINGS: Low lung volumes accentuating cardiac size and causing bronchovascular crowding. Mild bibasilar atelectasis. No confluent airspace disease, pleural effusion or pneumothorax. Stable osseous structures. IMPRESSION: Lung volumes accentuating the cardiac size and causing bronchovascular crowding. Electronically Signed   By: Jeb Levering M.D.   On: 08/03/2016 23:12   Mr Jodene Nam Head Wo Contrast  Result Date: 08/03/2016 CLINICAL DATA:  Altered mental status and weakness for 2 days. Recent fall. History of hypertension and diabetes. Assess for stroke. EXAM: MRI HEAD WITHOUT CONTRAST MRA HEAD WITHOUT CONTRAST TECHNIQUE: Multiplanar, multiecho pulse sequences of the brain and surrounding structures were obtained without intravenous contrast. Angiographic images of the head were obtained using MRA technique without contrast. COMPARISON:  None. FINDINGS: MRI HEAD FINDINGS- mildly motion degraded examination. BRAIN: Patchy reduced diffusion LEFT brachium pontis with low ADC values. No susceptibility artifact to suggest  hemorrhage. Faint susceptibility artifact associated with old RIGHT basal ganglia infarct. Old bilateral thalamus and bilateral basal ganglia lacunar infarcts. Patchy to confluent supratentorial white matter FLAIR T2 hyperintensities. Old LEFT cerebellum and pontine lacunar infarcts. Ventricles and sulci are normal for patient's age. No midline shift, mass effect or masses. No abnormal extra-axial fluid collections. VASCULAR: Major intracranial vascular flow voids present at skull base. SKULL AND UPPER CERVICAL SPINE: Expanded sella with heterogeneous possible mass along the inferior margin. No suspicious calvarial bone marrow signal. Craniocervical junction maintained. SINUSES/ORBITS: Trace paranasal sinus mucosal thickening. Mastoid air cells are well aerated. The included ocular globes and  orbital contents are non-suspicious. OTHER: None. MRA HEAD FINDINGS- severe motion through circle of Willis. ANTERIOR CIRCULATION: Normal flow related enhancement of the included cervical, petrous, cavernous and supraclinoid internal carotid arteries. Patent anterior communicating artery. Normal flow related enhancement of the anterior and middle cerebral arteries, including distal segments. Signal dropout through proximal M2 segments due to motion. No large vessel occlusion or aneurysm. POSTERIOR CIRCULATION: RIGHT vertebral artery is dominant. Basilar artery is patent, with normal flow related enhancement of the main branch vessels. Flow related enhancement bilateral posterior cerebral artery's though, signal loss due to motion. No large vessel occlusion or aneurysm. 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 Source images and MIP images were reviewed. IMPRESSION: MRI HEAD: 1. Acute LEFT brachium pontis infarcts. 2. 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 3. Moderate to severe chronic small vessel ischemic disease. 4. Suspected sellar mass for which  MRI sella protocol with contrast is recommended on a nonemergent basis. MRA HEAD: 1. Limited examination: Severe motion through circle of Willis. 2. No emergent large vessel occlusion. Electronically Signed   By: Elon Alas M.D.   On: 08/03/2016 17:31   Mr Brain Wo Contrast  Result Date: 08/03/2016 CLINICAL DATA:  Altered mental status and weakness for 2 days. Recent fall. History of hypertension and diabetes. Assess for stroke. EXAM: MRI HEAD WITHOUT CONTRAST MRA HEAD WITHOUT CONTRAST TECHNIQUE: Multiplanar, multiecho pulse sequences of the brain and surrounding structures were obtained without intravenous contrast. Angiographic images of the head were obtained using MRA technique without contrast. COMPARISON:  None. FINDINGS: MRI HEAD FINDINGS- mildly motion degraded examination. BRAIN: Patchy reduced diffusion LEFT brachium pontis with low ADC values. No susceptibility artifact to suggest hemorrhage. Faint susceptibility artifact associated with old RIGHT basal ganglia infarct. Old bilateral thalamus and bilateral basal ganglia lacunar infarcts. Patchy to confluent supratentorial white matter FLAIR T2 hyperintensities. Old LEFT cerebellum and pontine lacunar infarcts. Ventricles and sulci are normal for patient's age. No midline shift, mass effect or masses. No abnormal extra-axial fluid collections. VASCULAR: Major intracranial vascular flow voids present at skull base. SKULL AND UPPER CERVICAL SPINE: Expanded sella with heterogeneous possible mass along the inferior margin. No suspicious calvarial bone marrow signal. Craniocervical junction maintained. SINUSES/ORBITS: Trace paranasal sinus mucosal thickening. Mastoid air cells are well aerated. The included ocular globes and orbital contents are non-suspicious. OTHER: None. MRA HEAD FINDINGS- severe motion through circle of Willis. ANTERIOR CIRCULATION: Normal flow related enhancement of the included cervical, petrous, cavernous and supraclinoid  internal carotid arteries. Patent anterior communicating artery. Normal flow related enhancement of the anterior and middle cerebral arteries, including distal segments. Signal dropout through proximal M2 segments due to motion. No large vessel occlusion or aneurysm. POSTERIOR CIRCULATION: RIGHT vertebral artery is dominant. Basilar artery is patent, with normal flow related enhancement of the main branch vessels. Flow related enhancement bilateral posterior cerebral artery's though, signal loss due to motion. No large vessel occlusion or aneurysm. 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 Source images and MIP images were reviewed. IMPRESSION: MRI HEAD: 1. Acute LEFT brachium pontis infarcts. 2. 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 3. Moderate to severe chronic small vessel ischemic disease. 4. 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 MRA HEAD: 1. Limited examination: Severe motion through circle of Willis. 2. No emergent large vessel occlusion. Electronically Signed   By: Elon Alas M.D.   On:  08/03/2016 17:31       Assessment: 66 y.o. male with medical history significant of HTN, DM P/W 2 weeks of dizziness and gait abnormalities with MRI showing subacute L pontine stroke.  -stroke work up: including ECHO, Lipide profile, A1C -Carotid ultrasound -Secondary stroke prophylaxis with risks adjustments, DM management, HTN controt  -continue ASA, add statin. -PT/OT  -GI/DVT prophylaxis   Stroke Risk Factors - hypertension

## 2016-08-04 NOTE — Progress Notes (Signed)
Occupational Therapy Evaluation Patient Details Name: Blake Moran MRN: [id number] DOB: 04/14/1950 Today's Date: 08/04/2016    History of Present Illness 66 y.o. male with medical history significant of HTN, DM, BPH, and vitamin D deficiency who has been complaining of dizziness for the ;ast 2 weeks, MRI - Acute LEFT brachium pontis infarcts   Clinical Impression   PTA, pt lived with family and was independent with ADL and mobility and worked full time loading boxes and drove. Pt demonstrates a significant functional decline due to below deficits. Pt appears to have difficulty with gaze stabilization, making ADL, IADL and mobility tasks difficult and increasing his risk of falls. Pt demonstrates nystagmus with R gaze and slow saccadic movements with complaints of diplopia with distant L gaze (worse after mobilizing). At this time, recommend CIR for rehab to facilitate safe DC home.  Recommend using eye patch, alternating patch every 2 hours at this time, to reduce symptoms of diplopia and increase safety with mobility.Discussed with nsg.  Will continue to assess vision with use of partial occlusion next session.     Follow Up Recommendations  CIR;Supervision/Assistance - 24 hour    Equipment Recommendations  3 in 1 bedside commode    Recommendations for Other Services Rehab consult     Precautions / Restrictions Precautions Precautions: Fall      Mobility Bed Mobility               General bed mobility comments: Pt OOB in chair  Transfers Overall transfer level: Needs assistance   Transfers: Sit to/from Stand Sit to Stand: Min assist         General transfer comment: initially rocking posteriorly. hesitant about removing his hand from the chair for stability    Balance Overall balance assessment: Needs assistance   Sitting balance-Leahy Scale: Fair       Standing balance-Leahy Scale: Poor                             ADL either performed or  assessed with clinical judgement   ADL Overall ADL's : Needs assistance/impaired     Grooming: Min guard;Standing   Upper Body Bathing: Set up;Sitting   Lower Body Bathing: Minimal assistance;Sit to/from stand   Upper Body Dressing : Set up;Sitting   Lower Body Dressing: Minimal assistance;Sit to/from stand   Toilet Transfer: Minimal assistance   Toileting- Clothing Manipulation and Hygiene: Minimal assistance;Sit to/from stand       Functional mobility during ADLs: Minimal assistance General ADL Comments: Mobility Improves with use of RW. Pt states "I feel really unsteady". Room feels like it's "spinning"     Vision Baseline Vision/History: Wears glasses Wears Glasses: Reading only Patient Visual Report: Diplopia;Blurring of vision;Eye fatigue/eye pain/headache Vision Assessment?: Yes Eye Alignment: Within Functional Limits Ocular Range of Motion: Restricted on the left Alignment/Gaze Preference: Within Defined Limits Tracking/Visual Pursuits: Decreased smoothness of horizontal tracking;Decreased smoothness of vertical tracking Saccades: Additional eye shifts occurred during testing;Additional head turns occurred during testing;Impaired - to be further tested in functional context;Decreased speed of saccadic movement Convergence: Within functional limits Visual Fields: Other (comment) (will further assess) Depth Perception: Overshoots Additional Comments: apparent nystagmus when looking R. Slow saccades and complaints of horizontal diplopia when looking L     Perception     Praxis Praxis Praxis tested?: Within functional limits    Pertinent Vitals/Pain Pain Assessment: No/denies pain     Hand Dominance Right   Extremity/Trunk Assessment  Upper Extremity Assessment Upper Extremity Assessment: LUE deficits/detail LUE Deficits / Details: feels LUE is more "uncoordinated"   Lower Extremity Assessment Lower Extremity Assessment: Defer to PT evaluation   Cervical  / Trunk Assessment Cervical / Trunk Assessment: Other exceptions Cervical / Trunk Exceptions: difficulty maintaining midline postural control at times   Communication Communication Communication: No difficulties   Cognition Arousal/Alertness: Awake/alert Behavior During Therapy: WFL for tasks assessed/performed Overall Cognitive Status: Within Functional Limits for tasks assessed (appears [facility])                                     General Comments       Exercises     Shoulder Instructions      Home Living Family/patient expects to be discharged to:: Private residence Living Arrangements: Other relatives Available Help at Discharge: Family;Available 24 hours/day Type of Home: House Home Access: Stairs to enter [employer] of Steps: 6 Entrance Stairs-Rails: Right Home Layout: Multi-level (split level; lives on lower floor) Alternate Level Stairs-Number of Steps: 6   Bathroom Shower/Tub: Occupational psychologist: Handicapped height Bathroom Accessibility: No   Home Equipment: None      Lives With: Family (Aunt/Uncle)    Prior Functioning/Environment Level of Independence: Independent        Comments: drives/loads trucks full time        OT Problem List: Impaired balance (sitting and/or standing);Impaired vision/perception;Decreased coordination;Decreased safety awareness;Decreased knowledge of use of DME or AE;Obesity      OT Treatment/Interventions: Self-care/ADL training;Therapeutic exercise;Neuromuscular education;DME and/or AE instruction;Therapeutic activities;Visual/perceptual remediation/compensation;Patient/family education;Balance training    OT Goals(Current goals can be found in the care plan section) Acute Rehab OT Goals Patient Stated Goal: to get better OT Goal Formulation: With patient Time For Goal Achievement: 08/18/16 Potential to Achieve Goals: Good ADL Goals Pt Will Perform Lower Body Bathing: with modified  independence;sit to/from stand Pt Will Perform Lower Body Dressing: with modified independence;sit to/from stand Pt Will Transfer to Toilet: with modified independence;bedside commode;ambulating Pt Will Perform Toileting - Clothing Manipulation and hygiene: with modified independence;sit to/from stand Pt Will Perform Tub/Shower Transfer: rolling walker;3 in 1;ambulating;Shower transfer;with supervision Additional ADL Goal #1: Pt/caregiver will independently verbalize 3 strategies to compensate for visual deficits and ince=rease independence adn safety with ADL and mobility  OT Frequency: Min 2X/week   Barriers to D/C:            Co-evaluation              AM-PAC PT "6 Clicks" Daily Activity     Outcome Measure Help from another person eating meals?: None Help from another person taking care of personal grooming?: A Little Help from another person toileting, which includes using toliet, bedpan, or urinal?: A Little Help from another person bathing (including washing, rinsing, drying)?: A Little Help from another person to put on and taking off regular upper body clothing?: A Little Help from another person to put on and taking off regular lower body clothing?: A Little 6 Click Score: 19   End of Session Equipment Utilized During Treatment: Gait belt Nurse Communication: Mobility status  Activity Tolerance: Patient tolerated treatment well Patient left: in chair;with call bell/phone within reach;with family/visitor present  OT Visit Diagnosis: Unsteadiness on feet (R26.81);Muscle weakness (generalized) (M62.81);History of falling (Z91.81);Low vision, both eyes (H54.2)                Time:  1433-1500 OT Time Calculation (min): 27 min Charges:  OT General Charges $OT Visit: 1 Procedure OT Evaluation $OT Eval Moderate Complexity: 1 Procedure OT Treatments $Self Care/Home Management : 8-22 mins G-Codes:     [facility], OT/L  [phone] 08/04/2016  Evalina Tabak,HILLARY 08/04/2016,  3:47 PM

## 2016-08-04 NOTE — Progress Notes (Signed)
  Echocardiogram 2D Echocardiogram has been performed.  Clella Mckeel G Josette Shimabukuro 08/04/2016, 12:09 PM

## 2016-08-05 DIAGNOSIS — E785 Hyperlipidemia, unspecified: Secondary | ICD-10-CM

## 2016-08-05 DIAGNOSIS — E237 Disorder of pituitary gland, unspecified: Secondary | ICD-10-CM

## 2016-08-05 DIAGNOSIS — I519 Heart disease, unspecified: Secondary | ICD-10-CM

## 2016-08-05 DIAGNOSIS — D72829 Elevated white blood cell count, unspecified: Secondary | ICD-10-CM

## 2016-08-05 DIAGNOSIS — R42 Dizziness and giddiness: Secondary | ICD-10-CM

## 2016-08-05 DIAGNOSIS — D62 Acute posthemorrhagic anemia: Secondary | ICD-10-CM

## 2016-08-05 DIAGNOSIS — E236 Other disorders of pituitary gland: Secondary | ICD-10-CM

## 2016-08-05 DIAGNOSIS — I10 Essential (primary) hypertension: Secondary | ICD-10-CM

## 2016-08-05 DIAGNOSIS — I5189 Other ill-defined heart diseases: Secondary | ICD-10-CM

## 2016-08-05 LAB — GLUCOSE, CAPILLARY
GLUCOSE-CAPILLARY: 113 mg/dL — AB (ref 65–99)
GLUCOSE-CAPILLARY: 112 mg/dL — AB (ref 65–99)
Glucose-Capillary: 160 mg/dL — ABNORMAL HIGH (ref 65–99)
Glucose-Capillary: 109 mg/dL — ABNORMAL HIGH (ref 65–99)

## 2016-08-05 LAB — HEMOGLOBIN A1C
Hgb A1c MFr Bld: 6.5 % — ABNORMAL HIGH (ref 4.8–5.6)
MEAN PLASMA GLUCOSE: 140 mg/dL

## 2016-08-05 LAB — BASIC METABOLIC PANEL
Anion gap: 8 (ref 5–15)
BUN: 18 mg/dL (ref 6–20)
CALCIUM: 8.4 mg/dL — AB (ref 8.9–10.3)
CO2: 23 mmol/L (ref 22–32)
CREATININE: 1.49 mg/dL — AB (ref 0.61–1.24)
Chloride: 107 mmol/L (ref 101–111)
GFR, EST AFRICAN AMERICAN: 55 mL/min — AB (ref >60)
GFR, EST NON AFRICAN AMERICAN: 48 mL/min — AB (ref >60)
Glucose, Bld: 116 mg/dL — ABNORMAL HIGH (ref 65–99)
Potassium: 3.7 mmol/L (ref 3.5–5.1)
SODIUM: 138 mmol/L (ref 135–145)

## 2016-08-05 LAB — URINE CULTURE

## 2016-08-05 NOTE — Consult Note (Signed)
Physical Medicine and Rehabilitation Consult Reason for Consult: Decreased functional mobility Referring Physician: [facility]   HPI: Blake Moran is a 66 y.o. right handed male with history of CKD stage III, hypertension, diabetes mellitus and tobacco abuse. History taken from chart review and patient. Patient lives with aunt and uncle. Independent prior to admission working full time. Two-level home with bedroom downstairs and multiple entry steps. He also has a girlfriend and daughter in the area. Presented 08/03/2016 with dizziness/diplopia and unsteady gait 2 weeks waxing and waning as well as previous fall. MRI brain reviewed, showing left cerebellar CVA. Per report, acute left brachium pontis infarcts. 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 Moderate to severe chronic small vessel ischemic disease. MRA with no emergent large vessel occlusion. Patient did not receive TPA. Echocardiogram with ejection fraction of 95% grade 1 diastolic dysfunction. CT angiogram of head and neck showed mild atherosclerotic disease of the carotid bifurcation but without stenosis. 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 Currently maintained on aspirin for CVA prophylaxis. Subcutaneous Lovenox for DVT prophylaxis. Physical therapy evaluation completed 08/04/2016 with recommendations of physical medicine rehabilitation consult.   Review of Systems  Constitutional: Negative for chills and fever.  HENT: Negative for hearing loss.   Eyes: Positive for double vision.  Respiratory: Positive for cough. Negative for shortness of breath.   Cardiovascular: Negative for chest pain and palpitations.  Gastrointestinal: Positive for constipation. Negative for nausea and vomiting.  Genitourinary: Negative for dysuria, flank pain and hematuria.  Musculoskeletal: Positive for falls.  Skin: Negative for rash.  Neurological: Positive for dizziness.  Negative for seizures.  All other systems reviewed and are negative.  Past Medical History:  Diagnosis Date  . BPH (benign prostatic hyperplasia)   . CKD (chronic kidney disease), stage III   . Diabetes mellitus without complication ([place])   . Hypertension   . Vitamin D deficiency    Past Surgical History:  Procedure Laterality Date  . NO PAST SURGERIES     Family History  Problem Relation Age of Onset  . Cancer Mother 26  . CAD Father 87  . Stroke Neg Hx    Social History:  reports that he has been smoking.  He has a 22.00 pack-year smoking history. He has never used smokeless tobacco. He reports that he does not drink alcohol or use drugs. Allergies:  Allergies  Allergen Reactions  . Lisinopril Swelling   Medications Prior to Admission  Medication Sig Dispense Refill  . aspirin EC 81 MG tablet Take 81 mg by mouth daily.    Marland Kitchen doxazosin (CARDURA) 8 MG tablet Take 8 mg by mouth at bedtime.    Marland Kitchen losartan-hydrochlorothiazide (HYZAAR) 100-12.5 MG tablet Take 1 tablet by mouth daily.    . metFORMIN (GLUCOPHAGE) 500 MG tablet Take 500 mg by mouth 2 (two) times daily with a meal.    . Nebivolol HCl (BYSTOLIC) 20 MG TABS Take 20 mg by mouth daily.    . promethazine (PHENERGAN) 12.5 MG tablet Take 12.5 mg by mouth every 6 (six) hours as needed for nausea or vomiting.    . Vitamin D, Ergocalciferol, (DRISDOL) 50000 units CAPS capsule Take 50,000 Units by mouth every Tuesday.      Home: Home Living Family/patient expects to be discharged to:: Private residence Living Arrangements: Other relatives Available Help at Discharge: Family, Available 24 hours/day Type of Home: House Home Access: Stairs to enter [employer] of Steps: 6 Entrance  Stairs-Rails: Right Home Layout: Multi-level (split level; lives on lower floor) Alternate Level Stairs-Number of Steps: 6 Bathroom Shower/Tub: Multimedia programmer: Handicapped height Bathroom Accessibility: No Home  Equipment: None Additional Comments: Also has a lady "friend" and a daughter present during the evaluation.   Lives With: Family (Aunt/Uncle)  Functional History: Prior Function Level of Independence: Independent Comments: drives/loads trucks full time Functional Status:  Mobility: Bed Mobility Overal bed mobility: Needs Assistance Bed Mobility: Supine to Sit, Sit to Supine, Rolling Rolling: Modified independent (Device/Increase time) (use of rail) Supine to sit: Supervision Sit to supine: Supervision General bed mobility comments: supervision for safety during transitions, using bed rail to assist with control.  Transfers Overall transfer level: Needs assistance Equipment used: Rolling walker (2 wheeled), 2 person hand held assist Transfers: Sit to/from Stand Sit to Stand: Min assist General transfer comment: Min assist to support trunk during transitions, verbal cues for safe hand placement.  Tried both with and without RW both requiring min assist.  Ambulation/Gait Ambulation/Gait assistance: Mod assist, Min assist Ambulation Distance (Feet): 15 Feet (x2) Assistive device: Rolling walker (2 wheeled), 2 person hand held assist Gait Pattern/deviations: Step-through pattern, Staggering right, Drifts right/left, Narrow base of support General Gait Details: Pt with narrow base, staggering gait pattern without obvious ataxia, increased dizziness when up and moving around.  Pt did better with RW (min assist vs mod assist with two person hand held assit), however, he did not use a RW at baseline.     ADL: ADL Overall ADL's : Needs assistance/impaired Grooming: Min guard, Standing Upper Body Bathing: Set up, Sitting Lower Body Bathing: Minimal assistance, Sit to/from stand Upper Body Dressing : Set up, Sitting Lower Body Dressing: Minimal assistance, Sit to/from stand Toilet Transfer: Minimal assistance Toileting- Clothing Manipulation and Hygiene: Minimal assistance, Sit to/from  stand Functional mobility during ADLs: Minimal assistance General ADL Comments: Mobility Improves with use of RW. Pt states "I feel really unsteady". Room feels like it's "spinning"  Cognition: Cognition Overall Cognitive Status: Impaired/Different from baseline Arousal/Alertness: Awake/alert Orientation Level: Oriented X4 Attention: Sustained, Selective Sustained Attention: Appears intact Selective Attention: Impaired Memory: Impaired Memory Impairment: Retrieval deficit Awareness: Appears intact Problem Solving: Appears intact Cognition Arousal/Alertness: Awake/alert Behavior During Therapy: WFL for tasks assessed/performed Overall Cognitive Status: Impaired/Different from baseline Area of Impairment: Problem solving Problem Solving: Slow processing General Comments: Pt slow to answer, but when he does answer, it is accurate.    Blood pressure 124/76, pulse 77, temperature 98.4 F (36.9 C), temperature source Oral, resp. rate 20, height 5\' 8"  (1.727 m), weight 107 kg (235 lb 12.8 oz), SpO2 100 %. Physical Exam  Vitals reviewed. Constitutional: He is oriented to person, place, and time. He appears well-developed.  Obese  HENT:  Head: Normocephalic and atraumatic.  Eyes: EOM are normal. Right eye exhibits no discharge. Left eye exhibits no discharge.  Neck: Normal range of motion. Neck supple. No thyromegaly present.  Cardiovascular: Normal rate and regular rhythm.   Respiratory: Effort normal and breath sounds normal. No respiratory distress.  GI: Soft. Bowel sounds are normal. He exhibits no distension.  Musculoskeletal: He exhibits no edema or tenderness.  Neurological: He is alert and oriented to person, place, and time.  Follows commands.  Good awareness of deficits. Motor: 4+/5 throughout No ataxia  Skin: Skin is warm and dry.  Psychiatric: He has a normal mood and affect. His behavior is normal.    Results for orders placed or performed during the hospital  encounter  of 08/03/16 (from the past 24 hour(s))  Glucose, capillary     Status: Abnormal   Collection Time: 08/04/16  6:01 AM  Result Value Ref Range   Glucose-Capillary 114 (H) 65 - 99 mg/dL   Comment 1 Notify RN    Comment 2 Document in Chart   Troponin I     Status: None   Collection Time: 08/04/16  9:42 AM  Result Value Ref Range   Troponin I <0.03 <0.03 ng/mL  Basic metabolic panel     Status: Abnormal   Collection Time: 08/04/16  9:42 AM  Result Value Ref Range   Sodium 136 135 - 145 mmol/L   Potassium 3.3 (L) 3.5 - 5.1 mmol/L   Chloride 105 101 - 111 mmol/L   CO2 21 (L) 22 - 32 mmol/L   Glucose, Bld 112 (H) 65 - 99 mg/dL   BUN 22 (H) 6 - 20 mg/dL   Creatinine, Ser 1.68 (H) 0.61 - 1.24 mg/dL   Calcium 8.2 (L) 8.9 - 10.3 mg/dL   GFR calc non Af Amer 41 (L) >60 mL/min   GFR calc Af Amer 48 (L) >60 mL/min   Anion gap 10 5 - 15  CBC     Status: Abnormal   Collection Time: 08/04/16  9:42 AM  Result Value Ref Range   WBC 10.8 (H) 4.0 - 10.5 K/uL   RBC 4.44 4.22 - 5.81 MIL/uL   Hemoglobin 10.9 (L) 13.0 - 17.0 g/dL   HCT 34.1 (L) 39.0 - 52.0 %   MCV 76.8 (L) 78.0 - 100.0 fL   MCH 24.5 (L) 26.0 - 34.0 pg   MCHC 32.0 30.0 - 36.0 g/dL   RDW 14.0 11.5 - 15.5 %   Platelets 212 150 - 400 K/uL  Glucose, capillary     Status: Abnormal   Collection Time: 08/04/16 11:18 AM  Result Value Ref Range   Glucose-Capillary 195 (H) 65 - 99 mg/dL   Comment 1 Notify RN    Comment 2 Document in Chart   Glucose, capillary     Status: None   Collection Time: 08/04/16  5:04 PM  Result Value Ref Range   Glucose-Capillary 94 65 - 99 mg/dL   Comment 1 Notify RN    Comment 2 Document in Chart   Glucose, capillary     Status: Abnormal   Collection Time: 08/04/16 10:16 PM  Result Value Ref Range   Glucose-Capillary 109 (H) 65 - 99 mg/dL   Comment 1 Notify RN    Comment 2 Document in Chart    Ct Angio Head W Or Wo Contrast  Result Date: 08/04/2016 CLINICAL DATA:  Dizziness and difficulty  walking over the last 2 weeks. Acute infarctions in the left pons and middle cerebellar peduncle by MRI. EXAM: CT ANGIOGRAPHY HEAD AND NECK TECHNIQUE: Multidetector CT imaging of the head and neck was performed using the standard protocol during bolus administration of intravenous contrast. Multiplanar CT image reconstructions and MIPs were obtained to evaluate the vascular anatomy. Carotid stenosis measurements (when applicable) are obtained utilizing NASCET criteria, using the distal internal carotid diameter as the denominator. CONTRAST:  50 cc Isovue 370 COMPARISON:  MRI 08/03/2016 FINDINGS: CT HEAD FINDINGS Brain: Acute infarctions in the left lateral pons and middle cerebellar peduncle are not specifically visible by CT. Extensive chronic small-vessel ischemic changes remain evident affecting the pons, thalami I, basal ganglia and throughout the hemispheric white matter. No sign of large vessel territory infarction. No mass lesion, hemorrhage, hydrocephalus  or extra-axial collection. Vascular: There is atherosclerotic calcification of the major vessels at the base of the brain. 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 See results of previous MR report and recommendation for pituitary MRI. Orbits: Negative Review of the MIP images confirms the above findings CTA NECK FINDINGS Aortic arch: Aortic atherosclerosis.  No aneurysm or dissection. Right carotid system: Common carotid artery widely patent to the bifurcation. Carotid bifurcation shows minimal atherosclerotic change in the ICA bulb but no stenosis. Cervical ICA is tortuous but widely patent. Left carotid system: Common carotid artery widely patent to the bifurcation region. Mild atherosclerosis at the distal ICA bulb but no stenosis. Cervical ICA is tortuous but widely patent. Vertebral arteries: Right vertebral artery origin shows a small calcification but no stenosis. Right vertebral artery widely patent through the  cervical region. No calcified plaque at the left vertebral artery origin. Question small soft tissue density defect which could simply be artifact because of shoulder density. Cannot rule out small soft plaque or thrombosis of the left vertebral artery origin. Beyond that, the left vertebral artery is widely patent through the cervical region. Skeleton: Mild degenerative spondylosis. 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 No canal stenosis. Other neck: No mass or lymphadenopathy. Upper chest: Mild apical pleural and parenchymal scarring. Review of the MIP images confirms the above findings CTA HEAD FINDINGS Anterior circulation: Both internal carotid arteries are widely patent to the skullbase. There is ordinary siphon atherosclerotic calcification peripherally. No stenosis greater than 50%. Anterior and middle cerebral vessels are patent without proximal stenosis, aneurysm or vascular malformation. Distal vessels show mild to moderate atherosclerotic irregularity. Posterior circulation: Both vertebral arteries are patent at the foramen magnum level. Mild atherosclerotic calcification but no stenosis. Basilar artery is patent, with mild atherosclerotic irregularity. Superior cerebellar and posterior cerebral arteries are widely patent. Venous sinuses: Patent and normal. Anatomic variants: None significant Delayed phase: No abnormal enhancement Review of the MIP images confirms the above findings IMPRESSION: Mild atherosclerotic disease at the carotid bifurcation regions but without stenosis. Cervical internal carotid artery is tortuous, consistent with the history of hypertension. Carotid siphon atherosclerosis but no stenosis greater than 50%. No intracranial anterior circulation correctable proximal stenosis. Distal vessel atherosclerotic irregularity. Abnormal appearance of the proximal left vertebral artery by CTA. I am not certain if this is due to artifact from shoulder density or if there is a small plaque or  thrombus at the left vertebral artery origin. The vessel appears patent beyond that. I would favor this is artifactual, but cannot state that with certainty. If further imaging is desired, MR angiography with contrast could evaluate that location. Atherosclerotic irregularity of the vertebral artery but without flow limiting stenosis. Re- demonstration of sellar enlargement, apparently with a pituitary mass. See previous MRI recommendation. Electronically Signed   By: Nelson Chimes M.D.   On: 08/04/2016 09:40   Dg Chest 2 View  Result Date: 08/03/2016 CLINICAL DATA:  Transient ischemic attack. EXAM: CHEST  2 VIEW COMPARISON:  Radiograph 07/17/2016 FINDINGS: Low lung volumes accentuating cardiac size and causing bronchovascular crowding. Mild bibasilar atelectasis. No confluent airspace disease, pleural effusion or pneumothorax. Stable osseous structures. IMPRESSION: Lung volumes accentuating the cardiac size and causing bronchovascular crowding. Electronically Signed   By: Jeb Levering M.D.   On: 08/03/2016 23:12   Ct Angio Neck W Or Wo Contrast  Result Date: 08/04/2016 CLINICAL DATA:  Dizziness and difficulty walking over the last 2 weeks. Acute infarctions in the left pons and middle cerebellar  peduncle by MRI. EXAM: CT ANGIOGRAPHY HEAD AND NECK TECHNIQUE: Multidetector CT imaging of the head and neck was performed using the standard protocol during bolus administration of intravenous contrast. Multiplanar CT image reconstructions and MIPs were obtained to evaluate the vascular anatomy. Carotid stenosis measurements (when applicable) are obtained utilizing NASCET criteria, using the distal internal carotid diameter as the denominator. CONTRAST:  50 cc Isovue 370 COMPARISON:  MRI 08/03/2016 FINDINGS: CT HEAD FINDINGS Brain: Acute infarctions in the left lateral pons and middle cerebellar peduncle are not specifically visible by CT. Extensive chronic small-vessel ischemic changes remain evident affecting  the pons, thalami I, basal ganglia and throughout the hemispheric white matter. No sign of large vessel territory infarction. No mass lesion, hemorrhage, hydrocephalus or extra-axial collection. Vascular: There is atherosclerotic calcification of the major vessels at the base of the brain. 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 See results of previous MR report and recommendation for pituitary MRI. Orbits: Negative Review of the MIP images confirms the above findings CTA NECK FINDINGS Aortic arch: Aortic atherosclerosis.  No aneurysm or dissection. Right carotid system: Common carotid artery widely patent to the bifurcation. Carotid bifurcation shows minimal atherosclerotic change in the ICA bulb but no stenosis. Cervical ICA is tortuous but widely patent. Left carotid system: Common carotid artery widely patent to the bifurcation region. Mild atherosclerosis at the distal ICA bulb but no stenosis. Cervical ICA is tortuous but widely patent. Vertebral arteries: Right vertebral artery origin shows a small calcification but no stenosis. Right vertebral artery widely patent through the cervical region. No calcified plaque at the left vertebral artery origin. Question small soft tissue density defect which could simply be artifact because of shoulder density. Cannot rule out small soft plaque or thrombosis of the left vertebral artery origin. Beyond that, the left vertebral artery is widely patent through the cervical region. Skeleton: Mild degenerative spondylosis. 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 No canal stenosis. Other neck: No mass or lymphadenopathy. Upper chest: Mild apical pleural and parenchymal scarring. Review of the MIP images confirms the above findings CTA HEAD FINDINGS Anterior circulation: Both internal carotid arteries are widely patent to the skullbase. There is ordinary siphon atherosclerotic calcification peripherally. No stenosis greater than 50%. Anterior and  middle cerebral vessels are patent without proximal stenosis, aneurysm or vascular malformation. Distal vessels show mild to moderate atherosclerotic irregularity. Posterior circulation: Both vertebral arteries are patent at the foramen magnum level. Mild atherosclerotic calcification but no stenosis. Basilar artery is patent, with mild atherosclerotic irregularity. Superior cerebellar and posterior cerebral arteries are widely patent. Venous sinuses: Patent and normal. Anatomic variants: None significant Delayed phase: No abnormal enhancement Review of the MIP images confirms the above findings IMPRESSION: Mild atherosclerotic disease at the carotid bifurcation regions but without stenosis. Cervical internal carotid artery is tortuous, consistent with the history of hypertension. Carotid siphon atherosclerosis but no stenosis greater than 50%. No intracranial anterior circulation correctable proximal stenosis. Distal vessel atherosclerotic irregularity. Abnormal appearance of the proximal left vertebral artery by CTA. I am not certain if this is due to artifact from shoulder density or if there is a small plaque or thrombus at the left vertebral artery origin. The vessel appears patent beyond that. I would favor this is artifactual, but cannot state that with certainty. If further imaging is desired, MR angiography with contrast could evaluate that location. Atherosclerotic irregularity of the vertebral artery but without flow limiting stenosis. Re- demonstration of sellar enlargement, apparently with a pituitary mass. See  previous MRI recommendation. Electronically Signed   By: Nelson Chimes M.D.   On: 08/04/2016 09:40   Mr Jodene Nam Head Wo Contrast  Result Date: 08/03/2016 CLINICAL DATA:  Altered mental status and weakness for 2 days. Recent fall. History of hypertension and diabetes. Assess for stroke. EXAM: MRI HEAD WITHOUT CONTRAST MRA HEAD WITHOUT CONTRAST TECHNIQUE: Multiplanar, multiecho pulse sequences of the  brain and surrounding structures were obtained without intravenous contrast. Angiographic images of the head were obtained using MRA technique without contrast. COMPARISON:  None. FINDINGS: MRI HEAD FINDINGS- mildly motion degraded examination. BRAIN: Patchy reduced diffusion LEFT brachium pontis with low ADC values. No susceptibility artifact to suggest hemorrhage. Faint susceptibility artifact associated with old RIGHT basal ganglia infarct. Old bilateral thalamus and bilateral basal ganglia lacunar infarcts. Patchy to confluent supratentorial white matter FLAIR T2 hyperintensities. Old LEFT cerebellum and pontine lacunar infarcts. Ventricles and sulci are normal for patient's age. No midline shift, mass effect or masses. No abnormal extra-axial fluid collections. VASCULAR: Major intracranial vascular flow voids present at skull base. SKULL AND UPPER CERVICAL SPINE: Expanded sella with heterogeneous possible mass along the inferior margin. No suspicious calvarial bone marrow signal. Craniocervical junction maintained. SINUSES/ORBITS: Trace paranasal sinus mucosal thickening. Mastoid air cells are well aerated. The included ocular globes and orbital contents are non-suspicious. OTHER: None. MRA HEAD FINDINGS- severe motion through circle of Willis. ANTERIOR CIRCULATION: Normal flow related enhancement of the included cervical, petrous, cavernous and supraclinoid internal carotid arteries. Patent anterior communicating artery. Normal flow related enhancement of the anterior and middle cerebral arteries, including distal segments. Signal dropout through proximal M2 segments due to motion. No large vessel occlusion or aneurysm. POSTERIOR CIRCULATION: RIGHT vertebral artery is dominant. Basilar artery is patent, with normal flow related enhancement of the main branch vessels. Flow related enhancement bilateral posterior cerebral artery's though, signal loss due to motion. No large vessel occlusion or aneurysm. ANATOMIC  VARIANTS: Supernumerary anterior cerebral artery arising from LEFT A1-2 junction. Source images and MIP images were reviewed. IMPRESSION: MRI HEAD: 1. Acute LEFT brachium pontis infarcts. 2. 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 3. Moderate to severe chronic small vessel ischemic disease. 4. 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 MRA HEAD: 1. Limited examination: Severe motion through circle of Willis. 2. No emergent large vessel occlusion. Electronically Signed   By: Elon Alas M.D.   On: 08/03/2016 17:31   Mr Brain Wo Contrast  Result Date: 08/03/2016 CLINICAL DATA:  Altered mental status and weakness for 2 days. Recent fall. History of hypertension and diabetes. Assess for stroke. EXAM: MRI HEAD WITHOUT CONTRAST MRA HEAD WITHOUT CONTRAST TECHNIQUE: Multiplanar, multiecho pulse sequences of the brain and surrounding structures were obtained without intravenous contrast. Angiographic images of the head were obtained using MRA technique without contrast. COMPARISON:  None. FINDINGS: MRI HEAD FINDINGS- mildly motion degraded examination. BRAIN: Patchy reduced diffusion LEFT brachium pontis with low ADC values. No susceptibility artifact to suggest hemorrhage. Faint susceptibility artifact associated with old RIGHT basal ganglia infarct. Old bilateral thalamus and bilateral basal ganglia lacunar infarcts. Patchy to confluent supratentorial white matter FLAIR T2 hyperintensities. Old LEFT cerebellum and pontine lacunar infarcts. Ventricles and sulci are normal for patient's age. No midline shift, mass effect or masses. No abnormal extra-axial fluid collections. VASCULAR: Major intracranial vascular flow voids present at skull base. SKULL AND UPPER CERVICAL SPINE: Expanded sella with heterogeneous possible mass along the inferior margin. No suspicious calvarial  bone marrow signal. Craniocervical junction maintained.  SINUSES/ORBITS: Trace paranasal sinus mucosal thickening. Mastoid air cells are well aerated. The included ocular globes and orbital contents are non-suspicious. OTHER: None. MRA HEAD FINDINGS- severe motion through circle of Willis. ANTERIOR CIRCULATION: Normal flow related enhancement of the included cervical, petrous, cavernous and supraclinoid internal carotid arteries. Patent anterior communicating artery. Normal flow related enhancement of the anterior and middle cerebral arteries, including distal segments. Signal dropout through proximal M2 segments due to motion. No large vessel occlusion or aneurysm. POSTERIOR CIRCULATION: RIGHT vertebral artery is dominant. Basilar artery is patent, with normal flow related enhancement of the main branch vessels. Flow related enhancement bilateral posterior cerebral artery's though, signal loss due to motion. No large vessel occlusion or aneurysm. 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 Source images and MIP images were reviewed. IMPRESSION: MRI HEAD: 1. Acute LEFT brachium pontis infarcts. 2. 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 3. Moderate to severe chronic small vessel ischemic disease. 4. 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 MRA HEAD: 1. Limited examination: Severe motion through circle of Willis. 2. No emergent large vessel occlusion. Electronically Signed   By: Elon Alas M.D.   On: 08/03/2016 17:31   Mr Jeri Cos XL Contrast  Result Date: 08/04/2016 CLINICAL DATA:  Pituitary mass EXAM: MRI HEAD WITHOUT AND WITH CONTRAST TECHNIQUE: Multiplanar, multiecho pulse sequences of the brain and surrounding structures were obtained without and with intravenous contrast. CONTRAST:  61mL MULTIHANCE GADOBENATE DIMEGLUMINE 529 MG/ML IV SOLN COMPARISON:  Head CT 08/04/2016 Brain MRI 08/03/2016 FINDINGS: Pituitary/Sella: The  sella is expanded by a a bilobed, intrasellar, contrast-enhancing mass that measures 1.8 x 1.3 x 1.2 cm ( (AP x Transverse x CC)). The mass shows slightly decreased contrast enhancement relative to the normal gland parenchyma. There is minimal leftward deviation of the infundibulum. There is no mass effect on the optic chiasm. The hypothalamus and mamillary bodies are normal. No extension into the cavernous sinus. Brain: Small focus of diffusion restriction again seen in the left middle cerebellar peduncle, increased in size slightly. There are old infarcts of the left midbrain and right pons, as well as multiple old basal ganglia lacunar infarcts bilaterally. There is cytotoxic edema within the left middle cerebellar peduncle. Otherwise, there is early confluent hyperintense T2 weighted signal within the periventricular, subcortical and deep white matter. No midline shift or mass effect. No acute or chronic hemorrhage. No hydrocephalus, age advanced atrophy or lobar predominant volume loss. No dural abnormality or extra-axial collection. Vascular: Major intracranial arterial and venous sinus flow voids are preserved. Skull and upper cervical spine: The visualized skull base, calvarium, upper cervical spine and extracranial soft tissues are normal. Sinuses/Orbits: No fluid levels or advanced mucosal thickening. No mastoid effusion. Normal orbits. IMPRESSION: 1. 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 2. Bilobed, contrast-enhancing intrasellar mass, most consistent with pituitary macroadenoma. No mass effect on the optic chiasm. Electronically Signed   By: Ulyses Jarred M.D.   On: 08/04/2016 22:47    Assessment/Plan: Diagnosis: Left cerebellar infarct. Labs and images independently reviewed.  Records reviewed and summated above. Stroke: Continue secondary stroke prophylaxis and Risk Factor Modification listed below:   Antiplatelet  therapy:   Blood Pressure Management:  Continue current medication with prn's with permisive HTN per primary team Statin Agent:   Diabetes management:  Tobacco abuse:    1. Does the need for close, 24 hr/day medical supervision in concert with the patient's rehab needs make it unreasonable for this patient to be served in a less intensive setting? Yes  2. Co-Morbidities requiring supervision/potential complications: diastolic dysfunction (monitro for signs/symptoms of fluid overload), CKD stage III (avoid nephrotoxic meds), HTN (monitor and provide prns in accordance with increased physical exertion and pain), diabetes mellitus (Monitor in accordance with exercise and adjust meds as necessary), tobacco abuse (counsel), ABLA (transfuse if necessary to ensure appropriate perfusion for increased activity tolerance), leukocytosis (cont to monitor for signs and symptoms of infection, further workup if indicated), morbid obesity (Body mass index is 35.85 kg/m., diet and exercise education, encourage weight loss to increase endurance and promote overall health) 3. Due to safety, disease management and patient education, does the patient require 24 hr/day rehab nursing? Yes 4. Does the patient require coordinated care of a physician, rehab nurse, PT (1-2 hrs/day, 5 days/week) and OT (1-2 hrs/day, 5 days/week) to address physical and functional deficits in the context of the above medical diagnosis(es)? Yes Addressing deficits in the following areas: balance, endurance, locomotion, transferring, bathing, toileting and psychosocial support 5. Can the patient actively participate in an intensive therapy program of at least 3 hrs of therapy per day at least 5 days per week? Yes 6. The potential for patient to make measurable gains while on inpatient rehab is excellent 7. Anticipated functional outcomes upon discharge from inpatient rehab are supervision  with PT, modified independent and supervision with OT, n/a  with SLP. 8. Estimated rehab length of stay to reach the above functional goals is: 5-8 days. 9. Anticipated D/C setting: Home 10. Anticipated post D/C treatments: HH therapy and Home excercise program 11. Overall Rehab/Functional Prognosis: excellent and good  RECOMMENDATIONS: This patient's condition is appropriate for continued rehabilitative care in the following setting: CIR Patient has agreed to participate in recommended program. Potentially Note that insurance prior authorization may be required for reimbursement for recommended care.  Comment: Rehab Admissions Coordinator to follow up.  Delice Lesch, MD, Mellody Drown Cathlyn Parsons., PA-C 08/05/2016

## 2016-08-05 NOTE — Progress Notes (Signed)
Physical Therapy Treatment Patient Details Name: Blake Moran MRN: [id number] DOB: 06-11-1950 Today's Date: 08/05/2016    History of Present Illness 66 y.o. 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     PT Comments    Upon arrival pt is seated in recliner chair with daughter present in room. Pt gets up from the chair to go to the bathroom and requires reminder to not get up without help. Pt ambulated much longer distance today while using eye patch on Lt side. Pt reports dizziness and has increased lateral sway and shuffling of gait when completing head turns and looking up/down while walking. Pt reports fatigue after walking and requests to lay down in the bed.  After readjusting the eye patch after ambulating, the pt reports double vision with eye patch over Lt eye, and just blurry vision when patch is over Rt eye. Consider using patch over Rt eye next session during ambulation.  Pt will continue to benefit from acute therapy for ambulation and balance training for safe mobility.    Follow Up Recommendations  CIR     Equipment Recommendations  Rolling walker with 5" wheels    Recommendations for Other Services Rehab consult     Precautions / Restrictions Precautions Precautions: Fall Precaution Comments: pt had a recent fall forward when reaching down to the floor.     Mobility  Bed Mobility Overal bed mobility: Needs Assistance Bed Mobility: Supine to Sit;Sit to Supine;Rolling Rolling: Modified independent (Device/Increase time)   Supine to sit: Supervision Sit to supine: Supervision   General bed mobility comments: supervision for safety during transitions, using bed rail to assist with control.   Transfers Overall transfer level: Needs assistance    Transfers: Sit to/from Stand Sit to Stand: Min guard         General transfer comment: Min guard for safety; reminders for not getting up on own   Ambulation/Gait Ambulation/Gait assistance: Min assist (Min A for lateral sway during ambulation without RW ) Ambulation Distance (Feet): 350 Feet Assistive device: None Gait Pattern/deviations: Step-through pattern;Staggering right;Drifts right/left;Narrow base of support;Shuffle Gait velocity: 2.0 ft/sec (Pt ambulatd 2 ft/sec; however continues to stagger and had some shuffling today. Continues to be at risk for falls. )   General Gait Details: Pt observed to have some shuffling of gait once he began to feel more dizzy after ambulating ~ 200 ft. Pt was able to stop and readjust.    Stairs            Wheelchair Mobility    Modified Rankin (Stroke Patients Only)       Balance Overall balance assessment: Needs assistance Sitting-balance support: No upper extremity supported;Feet supported Sitting balance-Leahy Scale: Fair     Standing balance support: During functional activity;No upper extremity supported Standing balance-Leahy Scale: Poor Standing balance comment: Pt required min A for balance during ambulation without UE support d/t lateral sway                             Cognition Arousal/Alertness: Awake/alert Behavior During Therapy: WFL for tasks assessed/performed Overall Cognitive Status: Impaired/Different from baseline Area of Impairment: Problem solving  Problem Solving: Slow processing General Comments: Pt slow to answer, but when he does answer, it is accurate.        Exercises      General Comments        Pertinent Vitals/Pain Pain Assessment: No/denies pain    Home Living                      Prior Function            PT Goals (current goals can now be found in the care plan section) Acute Rehab PT Goals Patient Stated Goal:  to go to inpatient rehab to get better PT Goal Formulation: With patient/family Time For Goal Achievement: 08/19/16 Potential to Achieve Goals: Good    Frequency    Min 4X/week      PT Plan      Co-evaluation              AM-PAC PT "6 Clicks" Daily Activity  Outcome Measure  Difficulty turning over in bed (including adjusting bedclothes, sheets and blankets)?: None Difficulty moving from lying on back to sitting on the side of the bed? : None Difficulty sitting down on and standing up from a chair with arms (e.g., wheelchair, bedside commode, etc,.)?: Total Help needed moving to and from a bed to chair (including a wheelchair)?: A Little Help needed walking in hospital room?: A Little Help needed climbing 3-5 steps with a railing? : A Lot 6 Click Score: 17    End of Session Equipment Utilized During Treatment: Gait belt Activity Tolerance:  (Treatment limited by pt dizziness) Patient left: in bed;with call bell/phone within reach;with bed alarm set;with family/visitor present   PT Visit Diagnosis: Unsteadiness on feet (R26.81);Other symptoms and signs involving the nervous system (O03.704)     Time: 0155-0212 PT Time Calculation (min) (ACUTE ONLY): 17 min  Charges:  $Gait Training: 8-22 mins                    G Codes:       Blake Moran, SPT Acute Rehab [place] 08/05/2016, 2:55 PM

## 2016-08-05 NOTE — Progress Notes (Signed)
STROKE TEAM PROGRESS NOTE   SUBJECTIVE (INTERVAL HISTORY) The patient is sitting the chair watching television with an eye patch over his left eye.  He is alert, oriented, and follows all commands appropriately.  As per OT, he still has difficulty walking due to ataxia, as well as nystagumus and double vision.   OBJECTIVE Temp:  [98.2 F (36.8 C)-98.6 F (37 C)] 98.5 F (36.9 C) (07/04 1014) Pulse Rate:  [77-89] 82 (07/04 1014) Cardiac Rhythm: Normal sinus rhythm;Heart block (07/04 0700) Resp:  [16-20] 20 (07/04 1014) BP: (123-139)/(76-93) 123/76 (07/04 1014) SpO2:  [99 %-100 %] 100 % (07/04 1014)  CBC:   Recent Labs Lab 08/03/16 1543 08/03/16 1613 08/04/16 0942  WBC 13.7*  --  10.8*  NEUTROABS 10.7*  --   --   HGB 12.1* 12.6* 10.9*  HCT 36.6* 37.0* 34.1*  MCV 77.1*  --  76.8*  PLT 219  --  527    Basic Metabolic Panel:   Recent Labs Lab 08/03/16 1625 08/04/16 0942 08/05/16 0424  NA  --  136 138  K  --  3.3* 3.7  CL  --  105 107  CO2  --  21* 23  GLUCOSE  --  112* 116*  BUN  --  22* 18  CREATININE  --  1.68* 1.49*  CALCIUM  --  8.2* 8.4*  MG 2.2  --   --     Lipid Panel:     Component Value Date/Time   CHOL 123 08/04/2016 0338   TRIG 122 08/04/2016 0338   HDL 29 (L) 08/04/2016 0338   CHOLHDL 4.2 08/04/2016 0338   VLDL 24 08/04/2016 0338   LDLCALC 70 08/04/2016 0338   HgbA1c:  Lab Results  Component Value Date   HGBA1C 6.5 (H) 08/03/2016   Urine Drug Screen:     Component Value Date/Time   LABOPIA NONE DETECTED 08/03/2016 1541   COCAINSCRNUR NONE DETECTED 08/03/2016 1541   LABBENZ NONE DETECTED 08/03/2016 1541   AMPHETMU NONE DETECTED 08/03/2016 1541   THCU NONE DETECTED 08/03/2016 1541   LABBARB NONE DETECTED 08/03/2016 1541    Alcohol Level     Component Value Date/Time   ETH <5 08/03/2016 1543    IMAGING I have personally reviewed the radiological images below and agree with the radiology interpretations.  Ct Angio Head and neck W  and Wo Contrast 08/04/2016 IMPRESSION: Mild atherosclerotic disease at the carotid bifurcation regions but without stenosis. Cervical internal carotid artery is tortuous, consistent with the history of hypertension. Carotid siphon atherosclerosis but no stenosis greater than 50%. No intracranial anterior circulation correctable proximal stenosis. Distal vessel atherosclerotic irregularity. Abnormal appearance of the proximal left vertebral artery by CTA. I am not certain if this is due to artifact from shoulder density or if there is a small plaque or thrombus at the left vertebral artery origin. The vessel appears patent beyond that. I would favor this is artifactual, but cannot state that with certainty. If further imaging is desired, MR angiography with contrast could evaluate that location. Atherosclerotic irregularity of the vertebral artery but without flow limiting stenosis. Re- demonstration of sellar enlargement, apparently with a pituitary mass.  Dg Chest 2 View 08/03/2016 IMPRESSION: Lung volumes accentuating the cardiac size and causing bronchovascular crowding.  Mr Jodene Nam Head Wo Contrast 08/03/2016 IMPRESSION: MRI HEAD: 1. Acute LEFT brachium pontis infarcts. 2. 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 3. Moderate to severe chronic small vessel ischemic disease. 4. Suspected sellar mass for  which MRI sella protocol with contrast is recommended on a nonemergent basis. MRA HEAD: 1. Limited examination: Severe motion through circle of Willis. 2. No emergent large vessel occlusion.   TTE  - LVEF 60-65%, moderate LVH, normal wall motion, grade 1 DD, indeterminate LV filling pressure, mild aortic stenosis - AVA around 2.0 cm2, MAC with trivial MR, normal LA size, normal IVC.  MR Pituitary Protocol 08/04/2016 IMPRESSION: 1. 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 2. Bilobed, contrast-enhancing  intrasellar mass, most consistent with pituitary macroadenoma. No mass effect on the optic chiasm.     PHYSICAL EXAM  Temp:  [98.2 F (36.8 C)-98.6 F (37 C)] 98.5 F (36.9 C) (07/04 1014) Pulse Rate:  [77-89] 82 (07/04 1014) Resp:  [16-20] 20 (07/04 1014) BP: (123-139)/(76-93) 123/76 (07/04 1014) SpO2:  [99 %-100 %] 100 % (07/04 1014)  General - Well nourished, well developed, in no apparent distress.  Ophthalmologic - Sharp disc margins OU.   Cardiovascular - Regular rate and rhythm.  Mental Status -  Level of arousal and orientation to time, place, and person were intact. Language including expression, naming, repetition, comprehension was assessed and found intact. Fund of Knowledge was assessed and was intact  Cranial Nerves II - XII - II - Visual field intact OU. III, IV, VI - Extraocular movements intact. Pt has subjective diplopia with far left gaze. V - Facial sensation intact bilaterally. VII - mild peripheral left facial palsy with left nasolabial fold flattening and left eye weak close VIII - Hearing & vestibular intact bilaterally, nystagmus on right gaze. X - Palate elevates symmetrically. XI - Chin turning & shoulder shrug intact bilaterally. XII - Tongue protrusion intact.  Motor Strength - The patient's strength was normal in all extremities and pronator drift was absent.  Bulk was normal and fasciculations were absent.   Motor Tone - Muscle tone was assessed at the neck and appendages and was normal.  Reflexes - The patient's reflexes were 1+ in all extremities and he had no pathological reflexes.  Sensory - Light touch, temperature/pinprick were assessed and were symmetrical.    Coordination - The patient has mild dysmetria on left FTN and b/l HTS.  Tremor was absent.  Gait and Station - deferred.   ASSESSMENT/PLAN [name] is a 66 y.o. male with history of hypertension, type II diabetes mellitus, stage III CKD, and vitamin D deficiency who  presented with two weeks of waxing and waning vertiginous dizziness, staggering gait, and intermittently blurred vision.  He did not receive IV t-PA due to subacute symptoms/presenting outside of the treatment window.   Stroke: Acute left cerebral peduncle infarcts, likely due to small vessel disease  Resultant  Mild left peripheral facial palsy, right gaze nystagmus, left hand dysmetria   MRI head:  Acute LEFT brachium pontis infarcts. 2. 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    MRA head: No emergent large vessel occlusion.   MR Brain with Contrast: Slightly increased size of of acute infarct of the left middle cerebellar peduncle.   CTA head and neck - mild diffuse atherosclerosis  2D Echo: EF 60-65%.  No source of embolus.     TTE EF 60-65%  LDL 70  HgbA1c 6.5  Lovenox 40 mg sq daily for VTE prophylaxis Diet heart healthy/carb modified Room service appropriate? Yes; Fluid consistency: Thin Diet - low sodium heart healthy  aspirin 81 mg daily prior to admission,  now on aspirin 325 mg daily. Continue ASA on discharge.  Patient counseled to be compliant with his antithrombotic medications  Ongoing aggressive stroke risk factor management  Therapy recommendations: CIR;Supervision/Assistance - 24 hour   Disposition:  pending  Seller mass  MRI brain with contrast - Bilobed, contrast-enhancing intrasellar mass, most consistent with pituitary macroadenoma. No mass effect on the optic chiasm.  No visual field deficit seen   Outpt follow up with neurosurgery  Hypertension  Stable  Permissive hypertension (OK if < 220/120) but gradually normalize in 5-7 days  Long-term BP goal normotensive  Hyperlipidemia  Home meds:  noneal  LDL 70, goal < 70  Add atorvastatin 34m PO daily  Continue statin at discharge  Diabetes  HgbA1c 6.5, goal < 7.0  Controlled  SSI  Tobacco abuse  Current smoker  Smoking cessation counseling  provided  Pt is willing to quit  Other Stroke Risk Factors  Advanced age  Obesity, Body mass index is 35.85 kg/m., recommend weight loss, diet and exercise as appropriate   Hx stroke/TIA - by imaging  Other Active Problems  [facility]  Neurology will sign off. Please call with questions. [facility] Thanks for the consult.  JRosalin Hawking MD PhD Stroke Neurology 08/05/2016 11:27 AM   To contact Stroke Continuity provider, please refer to [email or web address] After hours, contact General Neurology

## 2016-08-05 NOTE — Progress Notes (Signed)
Occupational Therapy Treatment Patient Details Name: Blake Moran MRN: [id number] DOB: [birth year] Today's Date: 08/05/2016    History of present illness 66 y.o. 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    OT comments  Pt with increased complaints of "feeling dizzy, like I rode a roller coaster". Pt continues to demonstrate nystagmus in R gaze and double vision (occasionally) when looking R. Sustained visual attention is difficult for pt. Pt began working on gaze stabilization activities. Encouraged to wera eye patch during mobility/when he experiences diplopia. Will further assess with partial occlusion. Continue to recommend CIR for rehab. Excellent CIR candidate. Family present during session.   Follow Up Recommendations  CIR;Supervision/Assistance - 24 hour    Equipment Recommendations  3 in 1 bedside commode    Recommendations for Other Services Rehab consult    Precautions / Restrictions Precautions Precautions: Fall Precaution Comments: pt had a recent fall forward when reaching down to the floor.        Mobility Bed Mobility Overal bed mobility: s Bed Mobility: Supine to Sit;Sit to Supine;Rolling Rolling: Modified independent (Device/Increase time)   Supine to sit: Supervision Sit to supine: Supervision   General bed mobility comments: supervision for safety during transitions, using bed rail to assist with control.   Transfers Overall transfer level: Needs assistance   Transfers: Sit to/from Stand Sit to Stand: Min guard         General transfer comment: Min guard for safety; reminders for not getting up on own     Balance Overall balance assessment: Needs assistance Sitting-balance support: No upper extremity supported;Feet supported Sitting  balance-Leahy Scale: Fair     Standing balance support: During functional activity;No upper extremity supported Standing balance-Leahy Scale: Poor Standing balance comment: Pt required min A for balance during ambulation without UE support d/t lateral sway       Pt educated on using a target to focu on during mobility                     ADL either performed or assessed with clinical judgement   ADL Overall ADL's : Needs assistance/impaired                                             Vision   Additional Comments: Pt educated on tasks to work on gaze stabilization and visual pursuits. Pt states he has "double vision on and off, especailly when up and walking and looking to the L.  Pt has eye patch. Recommended pt use patch when walking and when he experiences double vision   Perception     Praxis      Cognition Arousal/Alertness: Awake/alert Behavior During Therapy: WFL for tasks assessed/performed Overall Cognitive Status: Impaired/Different from baseline Area of Impairment: Problem solving                             Problem Solving: Slow processing General Comments: Pt slow to answer, but when he does answer, it is accurate.          Exercises     Shoulder Instructions       General  Comments      Pertinent Vitals/ Pain       Pain Assessment: No/denies pain  Home Living                                          Prior Functioning/Environment              Frequency  Min 2X/week        Progress Toward Goals  OT Goals(current goals can now be found in the care plan section)  Progress towards OT goals: Progressing toward goals  Acute Rehab OT Goals Patient Stated Goal: to go to inpatient rehab to get better OT Goal Formulation: With patient Time For Goal Achievement: 08/18/16 Potential to Achieve Goals: Good ADL Goals Pt Will Perform Lower Body Bathing: with modified independence;sit to/from  stand Pt Will Perform Lower Body Dressing: with modified independence;sit to/from stand Pt Will Transfer to Toilet: with modified independence;bedside commode;ambulating Pt Will Perform Toileting - Clothing Manipulation and hygiene: with modified independence;sit to/from stand Pt Will Perform Tub/Shower Transfer: rolling walker;3 in 1;ambulating;Shower transfer;with supervision Additional ADL Goal #1: Pt/caregiver will independently verbalize 3 strategies to compensate for visual deficits and ince=rease independence adn safety with ADL and mobility  Plan Discharge plan remains appropriate    Co-evaluation                 AM-PAC PT "6 Clicks" Daily Activity     Outcome Measure   Help from another person eating meals?: None Help from another person taking care of personal grooming?: A Little Help from another person toileting, which includes using toliet, bedpan, or urinal?: A Little Help from another person bathing (including washing, rinsing, drying)?: A Little Help from another person to put on and taking off regular upper body clothing?: A Little Help from another person to put on and taking off regular lower body clothing?: A Little 6 Click Score: 19    End of Session    OT Visit Diagnosis: Unsteadiness on feet (R26.81);Muscle weakness (generalized) (M62.81);History of falling (Z91.81);Low vision, both eyes (H54.2)   Activity Tolerance Patient tolerated treatment well   Patient Left in bed;with call bell/phone within reach;with family/visitor present   Nurse Communication Mobility status        Time: 1440-1456 OT Time Calculation (min): 16 min  Charges: OT General Charges $OT Visit: 1 Procedure OT Treatments $Therapeutic Activity: 8-22 mins  [facility], OT/L  [phone] 08/05/2016   Culley Hedeen,HILLARY 08/05/2016, 3:56 PM

## 2016-08-05 NOTE — Progress Notes (Signed)
Received report on patient. Patient in bathroom, chair alarm going off, call bell at chairside. Non compliant re: calling for assistance.  Patient alert and oriented X 4. Denies pain. Will review orders and follow accordingly. Safety maintained at this time.

## 2016-08-05 NOTE — Progress Notes (Signed)
PROGRESS NOTE    Blake Moran   WEX:[id number]  DOB: [birth year]  DOA: 08/03/2016 PCP: Karma Ganja, NP   Brief Narrative:  66 y/o with HTN, DM 2, cigarette smoker, BPH who present with dizziness for 2 wks and trouble walking. He has had on and off double vision and blurry vision. 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   Subjective: Continues to have some mild dizziness, no blurry vision today, no double vision, denies any chest pain or shortness of breath    Assessment & Plan:   Principal Problem:   CVA (cerebral vascular accident)  - MRI >>  Left pontine ischemic infarct- affecting vision and gait - MRA>> Old basal ganglia, thalamus, pontine and LEFT cerebellar Infarcts, Moderate to severe chronic small vessel ischemic disease, suspicious for sellar mass- recommending MRI sella protocol with contrast is recommended on a nonemergent basis. - CTA head and neck: mil atherosclerotic disease- see report below - Lipid panel- LDL 70, HDL 29-continue Lipitor - 2 D ECHO- no thrombus noted - A1c 6.5  -  Aspirin 325  Recommended by neurology -  passed SLP eval - OT recommendations >> CIR, eye patch to alternate between eyes Q 2 hrs - social work has spoken with family- if he is declined by [facility], he will go home with family as they are not interested in SNF. 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 Outpatient Neurosurgery  referral has been provided   Cough/ yellow sputum in a cigarette smoker - started about 3 days ago- likely has acute viral bronchitis- no wheezing at this time and so no steroids needed - Continue Nebs QID- hold off on antibiotic for now and follow - need outpt eval for COPD with PFTs - needs to quit smoking    Hypokalemia - K - 2.6>> 3.4>3.7 - Mg normal at 2.2 - likley due to HCTZ -  replaced  Mild elevated troponin - 0.11>>  0.11 and then < 0.03 - EKG reviewed- unrevealing - likely due to CKD- no recent chest pain- DOE may be due to above respiratory infection - ECHO shows normal wall motion    CKD (chronic kidney disease), stage III - stable    Diabetes mellitus type 2 in obese (HCC)    Accu-Chek stable - cont SSI - Metformin on hold    Essential hypertension - can resume Bystolic today - hold losartan/ HCTZ for now (received contrast- has CKD 3)   BPH - Cardura resumed on admission   Pyruia - in setting of BPH - asymptomatic - hold off on treating, follow urine culture   Obesity, Body mass index is 35.85 kg/m., recommend weight loss, diet and exercise as appropriate   DVT prophylaxis: Lovenox Code Status: Full code Family Communication:  Disposition Plan: CIR   Consultants:   neurology Procedures:   2 D ECHO  LVEF 60-65%, moderate LVH, normal wall motion, grade 1 DD,   indeterminate LV filling pressure, mild aortic stenosis - AVA   around 2.0 cm2, MAC with trivial MR, normal LA size, normal IVC.   Antimicrobials:  Anti-infectives    Start     Dose/Rate Route Frequency Ordered Stop   08/03/16 1630  cefTRIAXone (ROCEPHIN) 1 g in dextrose 5 % 50 mL IVPB     1 g 100 mL/hr over 30 Minutes Intravenous  Once 08/03/16 1623  08/03/16 1721       Objective: Vitals:   08/04/16 1800 08/04/16 2217 08/05/16 0053 08/05/16 0525  BP: (!) 139/93 133/89 139/76 124/76  Pulse: 84 88 89 77  Resp: 16 20 20 20   Temp: 98.2 F (36.8 C) 98.6 F (37 C) 98.5 F (36.9 C) 98.4 F (36.9 C)  TempSrc: Oral Oral Oral Oral  SpO2: 99% 100% 100% 100%  Weight:      Height:        Intake/Output Summary (Last 24 hours) at 08/05/16 0843 Last data filed at 08/05/16 0526  Gross per 24 hour  Intake              600 ml  Output             1050 ml  Net             -450 ml   Filed Weights   08/03/16 1451 08/03/16 2154  Weight: 108 kg (238 lb) 107 kg (235 lb  12.8 oz)    Examination: General exam: Appears comfortable  HEENT: PERRLA, oral mucosa moist, no sclera icterus or thrush Respiratory system: Clear to auscultation. Respiratory effort normal. Cardiovascular system: S1 & S2 heard, RRR.  No murmurs  Gastrointestinal system: Abdomen soft, non-tender, nondistended. Normal bowel sound. No organomegaly Central nervous system: Alert and oriented. No focal neurological deficits. Extremities: No cyanosis, clubbing or edema Skin: No rashes or ulcers Psychiatry:  Mood & affect appropriate.     Data Reviewed: I have personally reviewed following labs and imaging studies  CBC:  Recent Labs Lab 08/03/16 1543 08/03/16 1613 08/04/16 0942  WBC 13.7*  --  10.8*  NEUTROABS 10.7*  --   --   HGB 12.1* 12.6* 10.9*  HCT 36.6* 37.0* 34.1*  MCV 77.1*  --  76.8*  PLT 219  --  427   Basic Metabolic Panel:  Recent Labs Lab 08/03/16 1543 08/03/16 1613 08/03/16 1625 08/04/16 0942 08/05/16 0424  NA 136 138  --  136 138  K 2.7* 2.6*  --  3.3* 3.7  CL 98* 99*  --  105 107  CO2 26  --   --  21* 23  GLUCOSE 132* 127*  --  112* 116*  BUN 23* 22*  --  22* 18  CREATININE 1.94* 1.80*  --  1.68* 1.49*  CALCIUM 8.6*  --   --  8.2* 8.4*  MG  --   --  2.2  --   --    GFR: Estimated Creatinine Clearance: 59.4 mL/min (A) (by C-G formula based on SCr of 1.49 mg/dL (H)). Liver Function Tests:  Recent Labs Lab 08/03/16 1543  AST 18  ALT 12*  ALKPHOS 81  BILITOT 1.1  PROT 7.8  ALBUMIN 3.2*   No results for input(s): LIPASE, AMYLASE in the last 168 hours. No results for input(s): AMMONIA in the last 168 hours. Coagulation Profile:  Recent Labs Lab 08/03/16 1543  INR 1.05   Cardiac Enzymes:  Recent Labs Lab 08/03/16 2157 08/04/16 0338 08/04/16 0942  TROPONINI 0.11* 0.11* <0.03   BNP (last 3 results) No results for input(s): PROBNP in the last 8760 hours. HbA1C:  Recent Labs  08/03/16 2157  HGBA1C 6.5*   CBG:  Recent  Labs Lab 08/04/16 0601 08/04/16 1118 08/04/16 1704 08/04/16 2216 08/05/16 0609  GLUCAP 114* 195* 94 109* 113*   Lipid Profile:  Recent Labs  08/04/16 0338  CHOL 123  HDL 29*  LDLCALC 70  TRIG 122  CHOLHDL 4.2   Thyroid Function Tests: No results for input(s): TSH, T4TOTAL, FREET4, T3FREE, THYROIDAB in the last 72 hours. Anemia Panel: No results for input(s): VITAMINB12, FOLATE, FERRITIN, TIBC, IRON, RETICCTPCT in the last 72 hours. Urine analysis:    Component Value Date/Time   COLORURINE AMBER (A) 08/03/2016 1541   APPEARANCEUR HAZY (A) 08/03/2016 1541   LABSPEC 1.018 08/03/2016 1541   PHURINE 6.0 08/03/2016 1541   GLUCOSEU NEGATIVE 08/03/2016 1541   HGBUR SMALL (A) 08/03/2016 1541   BILIRUBINUR NEGATIVE 08/03/2016 1541   KETONESUR NEGATIVE 08/03/2016 1541   PROTEINUR >=300 (A) 08/03/2016 1541   NITRITE NEGATIVE 08/03/2016 1541   LEUKOCYTESUR MODERATE (A) 08/03/2016 1541   Sepsis Labs: @LABRCNTIP (procalcitonin:4,lacticidven:4) ) Recent Results (from the past 240 hour(s))  Blood culture (routine x 2)     Status: None (Preliminary result)   Collection Time: 08/03/16  5:50 PM  Result Value Ref Range Status   Specimen Description LEFT ANTECUBITAL  Final   Special Requests   Final    BOTTLES DRAWN AEROBIC AND ANAEROBIC Blood Culture adequate volume   Culture NO GROWTH < 24 HOURS  Final   Report Status PENDING  Incomplete  Blood culture (routine x 2)     Status: None (Preliminary result)   Collection Time: 08/03/16  5:50 PM  Result Value Ref Range Status   Specimen Description BLOOD LEFT HAND  Final   Special Requests   Final    BOTTLES DRAWN AEROBIC AND ANAEROBIC Blood Culture adequate volume   Culture NO GROWTH < 24 HOURS  Final   Report Status PENDING  Incomplete         Radiology Studies: Ct Angio Head W Or Wo Contrast  Result Date: 08/04/2016 CLINICAL DATA:  Dizziness and difficulty walking over the last 2 weeks. Acute infarctions in the left pons  and middle cerebellar peduncle by MRI. EXAM: CT ANGIOGRAPHY HEAD AND NECK TECHNIQUE: Multidetector CT imaging of the head and neck was performed using the standard protocol during bolus administration of intravenous contrast. Multiplanar CT image reconstructions and MIPs were obtained to evaluate the vascular anatomy. Carotid stenosis measurements (when applicable) are obtained utilizing NASCET criteria, using the distal internal carotid diameter as the denominator. CONTRAST:  50 cc Isovue 370 COMPARISON:  MRI 08/03/2016 FINDINGS: CT HEAD FINDINGS Brain: Acute infarctions in the left lateral pons and middle cerebellar peduncle are not specifically visible by CT. Extensive chronic small-vessel ischemic changes remain evident affecting the pons, thalami I, basal ganglia and throughout the hemispheric white matter. No sign of large vessel territory infarction. No mass lesion, hemorrhage, hydrocephalus or extra-axial collection. Vascular: There is atherosclerotic calcification of the major vessels at the base of the brain. 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 See results of previous MR report and recommendation for pituitary MRI. Orbits: Negative Review of the MIP images confirms the above findings CTA NECK FINDINGS Aortic arch: Aortic atherosclerosis.  No aneurysm or dissection. Right carotid system: Common carotid artery widely patent to the bifurcation. Carotid bifurcation shows minimal atherosclerotic change in the ICA bulb but no stenosis. Cervical ICA is tortuous but widely patent. Left carotid system: Common carotid artery widely patent to the bifurcation region. Mild atherosclerosis at the distal ICA bulb but no stenosis. Cervical ICA is tortuous but widely patent. Vertebral arteries: Right vertebral artery origin shows a small calcification but no stenosis. Right vertebral artery widely patent through the cervical region. No calcified plaque at the left vertebral artery  origin. Question small  soft tissue density defect which could simply be artifact because of shoulder density. Cannot rule out small soft plaque or thrombosis of the left vertebral artery origin. Beyond that, the left vertebral artery is widely patent through the cervical region. Skeleton: Mild degenerative spondylosis. 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 No canal stenosis. Other neck: No mass or lymphadenopathy. Upper chest: Mild apical pleural and parenchymal scarring. Review of the MIP images confirms the above findings CTA HEAD FINDINGS Anterior circulation: Both internal carotid arteries are widely patent to the skullbase. There is ordinary siphon atherosclerotic calcification peripherally. No stenosis greater than 50%. Anterior and middle cerebral vessels are patent without proximal stenosis, aneurysm or vascular malformation. Distal vessels show mild to moderate atherosclerotic irregularity. Posterior circulation: Both vertebral arteries are patent at the foramen magnum level. Mild atherosclerotic calcification but no stenosis. Basilar artery is patent, with mild atherosclerotic irregularity. Superior cerebellar and posterior cerebral arteries are widely patent. Venous sinuses: Patent and normal. Anatomic variants: None significant Delayed phase: No abnormal enhancement Review of the MIP images confirms the above findings IMPRESSION: Mild atherosclerotic disease at the carotid bifurcation regions but without stenosis. Cervical internal carotid artery is tortuous, consistent with the history of hypertension. Carotid siphon atherosclerosis but no stenosis greater than 50%. No intracranial anterior circulation correctable proximal stenosis. Distal vessel atherosclerotic irregularity. Abnormal appearance of the proximal left vertebral artery by CTA. I am not certain if this is due to artifact from shoulder density or if there is a small plaque or thrombus at the left vertebral artery origin. The vessel appears patent  beyond that. I would favor this is artifactual, but cannot state that with certainty. If further imaging is desired, MR angiography with contrast could evaluate that location. Atherosclerotic irregularity of the vertebral artery but without flow limiting stenosis. Re- demonstration of sellar enlargement, apparently with a pituitary mass. See previous MRI recommendation. Electronically Signed   By: Nelson Chimes M.D.   On: 08/04/2016 09:40   Dg Chest 2 View  Result Date: 08/03/2016 CLINICAL DATA:  Transient ischemic attack. EXAM: CHEST  2 VIEW COMPARISON:  Radiograph 07/17/2016 FINDINGS: Low lung volumes accentuating cardiac size and causing bronchovascular crowding. Mild bibasilar atelectasis. No confluent airspace disease, pleural effusion or pneumothorax. Stable osseous structures. IMPRESSION: Lung volumes accentuating the cardiac size and causing bronchovascular crowding. Electronically Signed   By: Jeb Levering M.D.   On: 08/03/2016 23:12   Ct Angio Neck W Or Wo Contrast  Result Date: 08/04/2016 CLINICAL DATA:  Dizziness and difficulty walking over the last 2 weeks. Acute infarctions in the left pons and middle cerebellar peduncle by MRI. EXAM: CT ANGIOGRAPHY HEAD AND NECK TECHNIQUE: Multidetector CT imaging of the head and neck was performed using the standard protocol during bolus administration of intravenous contrast. Multiplanar CT image reconstructions and MIPs were obtained to evaluate the vascular anatomy. Carotid stenosis measurements (when applicable) are obtained utilizing NASCET criteria, using the distal internal carotid diameter as the denominator. CONTRAST:  50 cc Isovue 370 COMPARISON:  MRI 08/03/2016 FINDINGS: CT HEAD FINDINGS Brain: Acute infarctions in the left lateral pons and middle cerebellar peduncle are not specifically visible by CT. Extensive chronic small-vessel ischemic changes remain evident affecting the pons, thalami I, basal ganglia and throughout the hemispheric white  matter. No sign of large vessel territory infarction. No mass lesion, hemorrhage, hydrocephalus or extra-axial collection. Vascular: There is atherosclerotic calcification of the major vessels at the base of the brain. 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 See  results of previous MR report and recommendation for pituitary MRI. Orbits: Negative Review of the MIP images confirms the above findings CTA NECK FINDINGS Aortic arch: Aortic atherosclerosis.  No aneurysm or dissection. Right carotid system: Common carotid artery widely patent to the bifurcation. Carotid bifurcation shows minimal atherosclerotic change in the ICA bulb but no stenosis. Cervical ICA is tortuous but widely patent. Left carotid system: Common carotid artery widely patent to the bifurcation region. Mild atherosclerosis at the distal ICA bulb but no stenosis. Cervical ICA is tortuous but widely patent. Vertebral arteries: Right vertebral artery origin shows a small calcification but no stenosis. Right vertebral artery widely patent through the cervical region. No calcified plaque at the left vertebral artery origin. Question small soft tissue density defect which could simply be artifact because of shoulder density. Cannot rule out small soft plaque or thrombosis of the left vertebral artery origin. Beyond that, the left vertebral artery is widely patent through the cervical region. Skeleton: Mild degenerative spondylosis. 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 No canal stenosis. Other neck: No mass or lymphadenopathy. Upper chest: Mild apical pleural and parenchymal scarring. Review of the MIP images confirms the above findings CTA HEAD FINDINGS Anterior circulation: Both internal carotid arteries are widely patent to the skullbase. There is ordinary siphon atherosclerotic calcification peripherally. No stenosis greater than 50%. Anterior and middle cerebral vessels are patent without proximal stenosis, aneurysm or  vascular malformation. Distal vessels show mild to moderate atherosclerotic irregularity. Posterior circulation: Both vertebral arteries are patent at the foramen magnum level. Mild atherosclerotic calcification but no stenosis. Basilar artery is patent, with mild atherosclerotic irregularity. Superior cerebellar and posterior cerebral arteries are widely patent. Venous sinuses: Patent and normal. Anatomic variants: None significant Delayed phase: No abnormal enhancement Review of the MIP images confirms the above findings IMPRESSION: Mild atherosclerotic disease at the carotid bifurcation regions but without stenosis. Cervical internal carotid artery is tortuous, consistent with the history of hypertension. Carotid siphon atherosclerosis but no stenosis greater than 50%. No intracranial anterior circulation correctable proximal stenosis. Distal vessel atherosclerotic irregularity. Abnormal appearance of the proximal left vertebral artery by CTA. I am not certain if this is due to artifact from shoulder density or if there is a small plaque or thrombus at the left vertebral artery origin. The vessel appears patent beyond that. I would favor this is artifactual, but cannot state that with certainty. If further imaging is desired, MR angiography with contrast could evaluate that location. Atherosclerotic irregularity of the vertebral artery but without flow limiting stenosis. Re- demonstration of sellar enlargement, apparently with a pituitary mass. See previous MRI recommendation. Electronically Signed   By: Nelson Chimes M.D.   On: 08/04/2016 09:40   Mr Jodene Nam Head Wo Contrast  Result Date: 08/03/2016 CLINICAL DATA:  Altered mental status and weakness for 2 days. Recent fall. History of hypertension and diabetes. Assess for stroke. EXAM: MRI HEAD WITHOUT CONTRAST MRA HEAD WITHOUT CONTRAST TECHNIQUE: Multiplanar, multiecho pulse sequences of the brain and surrounding structures were obtained without intravenous  contrast. Angiographic images of the head were obtained using MRA technique without contrast. COMPARISON:  None. FINDINGS: MRI HEAD FINDINGS- mildly motion degraded examination. BRAIN: Patchy reduced diffusion LEFT brachium pontis with low ADC values. No susceptibility artifact to suggest hemorrhage. Faint susceptibility artifact associated with old RIGHT basal ganglia infarct. Old bilateral thalamus and bilateral basal ganglia lacunar infarcts. Patchy to confluent supratentorial white matter FLAIR T2 hyperintensities. Old LEFT cerebellum and pontine lacunar infarcts. Ventricles and sulci are normal for  patient's age. No midline shift, mass effect or masses. No abnormal extra-axial fluid collections. VASCULAR: Major intracranial vascular flow voids present at skull base. SKULL AND UPPER CERVICAL SPINE: Expanded sella with heterogeneous possible mass along the inferior margin. No suspicious calvarial bone marrow signal. Craniocervical junction maintained. SINUSES/ORBITS: Trace paranasal sinus mucosal thickening. Mastoid air cells are well aerated. The included ocular globes and orbital contents are non-suspicious. OTHER: None. MRA HEAD FINDINGS- severe motion through circle of Willis. ANTERIOR CIRCULATION: Normal flow related enhancement of the included cervical, petrous, cavernous and supraclinoid internal carotid arteries. Patent anterior communicating artery. Normal flow related enhancement of the anterior and middle cerebral arteries, including distal segments. Signal dropout through proximal M2 segments due to motion. No large vessel occlusion or aneurysm. POSTERIOR CIRCULATION: RIGHT vertebral artery is dominant. Basilar artery is patent, with normal flow related enhancement of the main branch vessels. Flow related enhancement bilateral posterior cerebral artery's though, signal loss due to motion. No large vessel occlusion or aneurysm. ANATOMIC VARIANTS: Supernumerary anterior cerebral artery arising from LEFT  A1-2 junction. Source images and MIP images were reviewed. IMPRESSION: MRI HEAD: 1. Acute LEFT brachium pontis infarcts. 2. 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 3. Moderate to severe chronic small vessel ischemic disease. 4. 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 MRA HEAD: 1. Limited examination: Severe motion through circle of Willis. 2. No emergent large vessel occlusion. Electronically Signed   By: Elon Alas M.D.   On: 08/03/2016 17:31   Mr Brain Wo Contrast  Result Date: 08/03/2016 CLINICAL DATA:  Altered mental status and weakness for 2 days. Recent fall. History of hypertension and diabetes. Assess for stroke. EXAM: MRI HEAD WITHOUT CONTRAST MRA HEAD WITHOUT CONTRAST TECHNIQUE: Multiplanar, multiecho pulse sequences of the brain and surrounding structures were obtained without intravenous contrast. Angiographic images of the head were obtained using MRA technique without contrast. COMPARISON:  None. FINDINGS: MRI HEAD FINDINGS- mildly motion degraded examination. BRAIN: Patchy reduced diffusion LEFT brachium pontis with low ADC values. No susceptibility artifact to suggest hemorrhage. Faint susceptibility artifact associated with old RIGHT basal ganglia infarct. Old bilateral thalamus and bilateral basal ganglia lacunar infarcts. Patchy to confluent supratentorial white matter FLAIR T2 hyperintensities. Old LEFT cerebellum and pontine lacunar infarcts. Ventricles and sulci are normal for patient's age. No midline shift, mass effect or masses. No abnormal extra-axial fluid collections. VASCULAR: Major intracranial vascular flow voids present at skull base. SKULL AND UPPER CERVICAL SPINE: Expanded sella with heterogeneous possible mass along the inferior margin. No suspicious calvarial bone marrow signal. Craniocervical junction maintained. SINUSES/ORBITS: Trace paranasal sinus mucosal thickening. Mastoid  air cells are well aerated. The included ocular globes and orbital contents are non-suspicious. OTHER: None. MRA HEAD FINDINGS- severe motion through circle of Willis. ANTERIOR CIRCULATION: Normal flow related enhancement of the included cervical, petrous, cavernous and supraclinoid internal carotid arteries. Patent anterior communicating artery. Normal flow related enhancement of the anterior and middle cerebral arteries, including distal segments. Signal dropout through proximal M2 segments due to motion. No large vessel occlusion or aneurysm. POSTERIOR CIRCULATION: RIGHT vertebral artery is dominant. Basilar artery is patent, with normal flow related enhancement of the main branch vessels. Flow related enhancement bilateral posterior cerebral artery's though, signal loss due to motion. No large vessel occlusion or aneurysm. 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 Source images and MIP images were reviewed. IMPRESSION: MRI HEAD: 1. Acute LEFT brachium pontis infarcts.  2. 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 3. Moderate to severe chronic small vessel ischemic disease. 4. 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 MRA HEAD: 1. Limited examination: Severe motion through circle of Willis. 2. No emergent large vessel occlusion. Electronically Signed   By: Elon Alas M.D.   On: 08/03/2016 17:31   Mr Jeri Cos YN Contrast  Result Date: 08/04/2016 CLINICAL DATA:  Pituitary mass EXAM: MRI HEAD WITHOUT AND WITH CONTRAST TECHNIQUE: Multiplanar, multiecho pulse sequences of the brain and surrounding structures were obtained without and with intravenous contrast. CONTRAST:  54m MULTIHANCE GADOBENATE DIMEGLUMINE 529 MG/ML IV SOLN COMPARISON:  Head CT 08/04/2016 Brain MRI 08/03/2016 FINDINGS: Pituitary/Sella: The sella is expanded by a a bilobed, intrasellar, contrast-enhancing  mass that measures 1.8 x 1.3 x 1.2 cm ( (AP x Transverse x CC)). The mass shows slightly decreased contrast enhancement relative to the normal gland parenchyma. There is minimal leftward deviation of the infundibulum. There is no mass effect on the optic chiasm. The hypothalamus and mamillary bodies are normal. No extension into the cavernous sinus. Brain: Small focus of diffusion restriction again seen in the left middle cerebellar peduncle, increased in size slightly. There are old infarcts of the left midbrain and right pons, as well as multiple old basal ganglia lacunar infarcts bilaterally. There is cytotoxic edema within the left middle cerebellar peduncle. Otherwise, there is early confluent hyperintense T2 weighted signal within the periventricular, subcortical and deep white matter. No midline shift or mass effect. No acute or chronic hemorrhage. No hydrocephalus, age advanced atrophy or lobar predominant volume loss. No dural abnormality or extra-axial collection. Vascular: Major intracranial arterial and venous sinus flow voids are preserved. Skull and upper cervical spine: The visualized skull base, calvarium, upper cervical spine and extracranial soft tissues are normal. Sinuses/Orbits: No fluid levels or advanced mucosal thickening. No mastoid effusion. Normal orbits. IMPRESSION: 1. 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 2. Bilobed, contrast-enhancing intrasellar mass, most consistent with pituitary macroadenoma. No mass effect on the optic chiasm. Electronically Signed   By: KUlyses JarredM.D.   On: 08/04/2016 22:47      Scheduled Meds: . atorvastatin  20 mg Oral q1800  . doxazosin  8 mg Oral QHS  . enoxaparin (LOVENOX) injection  40 mg Subcutaneous Q24H  . insulin aspart  0-15 Units Subcutaneous TID WC  . nebivolol  20 mg Oral Daily   Continuous Infusions:   LOS: 2 days    Time spent in minutes:  35    NReyne Dumas MD [facility] Pager: [email or web address] Password [email or web address]7/05/2016, 8:43 AM

## 2016-08-06 DIAGNOSIS — I6300 Cerebral infarction due to thrombosis of unspecified precerebral artery: Secondary | ICD-10-CM

## 2016-08-06 DIAGNOSIS — D62 Acute posthemorrhagic anemia: Secondary | ICD-10-CM

## 2016-08-06 LAB — GLUCOSE, CAPILLARY
GLUCOSE-CAPILLARY: 120 mg/dL — AB (ref 65–99)
GLUCOSE-CAPILLARY: 99 mg/dL (ref 65–99)
GLUCOSE-CAPILLARY: 142 mg/dL — AB (ref 65–99)
Glucose-Capillary: 111 mg/dL — ABNORMAL HIGH (ref 65–99)

## 2016-08-06 MED ORDER — SENNOSIDES-DOCUSATE SODIUM 8.6-50 MG PO TABS: 1.0000 | ORAL_TABLET | Freq: Every evening | ORAL | 1 refills | Status: DC | PRN

## 2016-08-06 MED ORDER — INSULIN ASPART 100 UNIT/ML ~~LOC~~ SOLN: SUBCUTANEOUS | 11 refills | Status: DC

## 2016-08-06 MED ORDER — LOSARTAN POTASSIUM-HCTZ 100-12.5 MG PO TABS: 1.0000 | ORAL_TABLET | Freq: Every day | ORAL | 0 refills | Status: DC

## 2016-08-06 MED ORDER — ATORVASTATIN CALCIUM 20 MG PO TABS
20.0000 mg | ORAL_TABLET | Freq: Every day | ORAL | 2 refills | Status: AC
Start: 2016-08-06 — End: ?

## 2016-08-06 MED ORDER — ASPIRIN EC 325 MG PO TBEC: 325.0000 mg | DELAYED_RELEASE_TABLET | Freq: Every day | ORAL | 3 refills | Status: AC

## 2016-08-06 NOTE — Discharge Summary (Signed)
Physician Discharge Summary  Blake Moran MRN: [id number] DOB/AGE: Feb 21, 1950 66 y.o.  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   Essential hypertension   Dizziness   Elevated serum creatinine   Urinary tract infection without hematuria   Pituitary mass (HCC)   Vertigo   Morbid obesity (HCC)   Diastolic dysfunction   Benign essential HTN   Acute blood loss anemia   Leukocytosis   Hyperlipidemia    Follow-up recommendations Follow-up with PCP in 3-5 days , including all  additional recommended appointments as below Follow-up CBC, CMP in 3-5 days Metformin discontinued due to renal insufficiency Resume antihypertensive medications in 1-2 weeks if renal function remained stable and monitor electrolytes and renal function closely      Current Discharge Medication List    START taking these medications   Details  atorvastatin (LIPITOR) 20 MG tablet Take 1 tablet (20 mg total) by mouth daily at 6 PM. Qty: 30 tablet, Refills: 2    insulin aspart (NOVOLOG) 100 UNIT/ML injection Correction coverage: Moderate (average weight, post-op)  CBG < 70: implement hypoglycemia protocol  CBG 70 - 120: 0 units  CBG 121 - 150: 2 units  CBG 151 - 200: 3 units  CBG 201 - 250: 5 units  CBG 251 - 300: 8 units  CBG 301 - 350: 11 units  CBG 351 - 400: 15 units  CBG > 400 call MD and obtain STAT lab verification Qty: 10 mL, Refills: 11    senna-docusate (SENOKOT-S) 8.6-50 MG tablet Take 1 tablet by mouth at bedtime as needed for mild constipation. Qty: 30 tablet, Refills: 1      CONTINUE these medications which have CHANGED   Details  aspirin EC 325 MG tablet Take 1 tablet  (325 mg total) by mouth daily. Qty: 30 tablet, Refills: 3    losartan-hydrochlorothiazide (HYZAAR) 100-12.5 MG tablet Take 1 tablet by mouth daily. Qty: 30 tablet, Refills: 0      CONTINUE these medications which have NOT CHANGED   Details  doxazosin (CARDURA) 8 MG tablet Take 8 mg by mouth at bedtime.    Nebivolol HCl (BYSTOLIC) 20 MG TABS Take 20 mg by mouth daily.    promethazine (PHENERGAN) 12.5 MG tablet Take 12.5 mg by mouth every 6 (six) hours as needed for nausea or vomiting.    Vitamin D, Ergocalciferol, (DRISDOL) 50000 units CAPS capsule Take 50,000 Units by mouth every Tuesday.      STOP taking these medications     metFORMIN (GLUCOPHAGE) 500 MG tablet          Discharge Condition:  Stable   Discharge Instructions Get Medicines reviewed and adjusted: Please take all your medications with you for your next visit with your Primary MD  Please request your Primary MD to go over all hospital tests and procedure/radiological results at the follow up, please ask your Primary MD to get all Hospital records sent to his/her office.  If you experience worsening of your admission symptoms, develop shortness of breath, life threatening emergency, suicidal or homicidal thoughts you must seek medical attention immediately by calling 911 or calling your MD immediately if symptoms less severe.  You must read  complete instructions/literature along with all the possible adverse reactions/side effects for all the Medicines you take and that have been prescribed to you. Take any new Medicines after you have completely understood and accpet all the possible adverse reactions/side effects.   Do not drive when taking Pain medications.   Do not take more than prescribed Pain, Sleep and Anxiety Medications  Special Instructions: If you have smoked or chewed Tobacco in the last 2 yrs please stop smoking, stop any regular Alcohol and or any Recreational drug use.  Wear Seat belts while  driving.  Please note  You were cared for by a hospitalist during your hospital stay. Once you are discharged, your primary care physician will handle any further medical issues. Please note that NO REFILLS for any discharge medications will be authorized once you are discharged, as it is imperative that you return to your primary care physician (or establish a relationship with a primary care physician if you do not have one) for your aftercare needs so that they can reassess your need for medications and monitor your lab values.  Discharge Instructions    Ambulatory referral to Neurology    Complete by:  As directed    Follow up with stroke clinic Cecille Rubin preferred, if not available, then consider Caesar Chestnut, [facility] or Jaynee Eagles whoever is available) at [facility] in about 6-8 weeks. Thanks.   Ambulatory referral to Neurosurgery    Complete by:  As directed    Sellar mass   Diet - low sodium heart healthy    Complete by:  As directed    Diet - low sodium heart healthy    Complete by:  As directed    Increase activity slowly    Complete by:  As directed    Increase activity slowly    Complete by:  As directed        Allergies  Allergen Reactions  . Lisinopril Swelling      Disposition: CIR   Consults:  Neurology    Significant Diagnostic Studies:  Ct Angio Head W Or Wo Contrast  Result Date: 08/04/2016 CLINICAL DATA:  Dizziness and difficulty walking over the last 2 weeks. Acute infarctions in the left pons and middle cerebellar peduncle by MRI. EXAM: CT ANGIOGRAPHY HEAD AND NECK TECHNIQUE: Multidetector CT imaging of the head and neck was performed using the standard protocol during bolus administration of intravenous contrast. Multiplanar CT image reconstructions and MIPs were obtained to evaluate the vascular anatomy. Carotid stenosis measurements (when applicable) are obtained utilizing NASCET criteria, using the distal internal carotid diameter as the denominator.  CONTRAST:  50 cc Isovue 370 COMPARISON:  MRI 08/03/2016 FINDINGS: CT HEAD FINDINGS Brain: Acute infarctions in the left lateral pons and middle cerebellar peduncle are not specifically visible by CT. Extensive chronic small-vessel ischemic changes remain evident affecting the pons, thalami I, basal ganglia and throughout the hemispheric white matter. No sign of large vessel territory infarction. No mass lesion, hemorrhage, hydrocephalus or extra-axial collection. Vascular: There is atherosclerotic calcification of the major vessels at the base of the brain. 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 See results of previous MR report and recommendation for pituitary MRI. Orbits: Negative Review of the MIP images confirms the above findings CTA NECK FINDINGS Aortic arch: Aortic atherosclerosis.  No aneurysm or dissection. Right carotid system: Common carotid artery widely patent to the bifurcation. Carotid bifurcation shows minimal atherosclerotic change in the ICA bulb but no stenosis. Cervical ICA is tortuous but  widely patent. Left carotid system: Common carotid artery widely patent to the bifurcation region. Mild atherosclerosis at the distal ICA bulb but no stenosis. Cervical ICA is tortuous but widely patent. Vertebral arteries: Right vertebral artery origin shows a small calcification but no stenosis. Right vertebral artery widely patent through the cervical region. No calcified plaque at the left vertebral artery origin. Question small soft tissue density defect which could simply be artifact because of shoulder density. Cannot rule out small soft plaque or thrombosis of the left vertebral artery origin. Beyond that, the left vertebral artery is widely patent through the cervical region. Skeleton: Mild degenerative spondylosis. 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 No canal stenosis. Other neck: No mass or lymphadenopathy. Upper chest: Mild apical pleural and parenchymal scarring.  Review of the MIP images confirms the above findings CTA HEAD FINDINGS Anterior circulation: Both internal carotid arteries are widely patent to the skullbase. There is ordinary siphon atherosclerotic calcification peripherally. No stenosis greater than 50%. Anterior and middle cerebral vessels are patent without proximal stenosis, aneurysm or vascular malformation. Distal vessels show mild to moderate atherosclerotic irregularity. Posterior circulation: Both vertebral arteries are patent at the foramen magnum level. Mild atherosclerotic calcification but no stenosis. Basilar artery is patent, with mild atherosclerotic irregularity. Superior cerebellar and posterior cerebral arteries are widely patent. Venous sinuses: Patent and normal. Anatomic variants: None significant Delayed phase: No abnormal enhancement Review of the MIP images confirms the above findings IMPRESSION: Mild atherosclerotic disease at the carotid bifurcation regions but without stenosis. Cervical internal carotid artery is tortuous, consistent with the history of hypertension. Carotid siphon atherosclerosis but no stenosis greater than 50%. No intracranial anterior circulation correctable proximal stenosis. Distal vessel atherosclerotic irregularity. Abnormal appearance of the proximal left vertebral artery by CTA. I am not certain if this is due to artifact from shoulder density or if there is a small plaque or thrombus at the left vertebral artery origin. The vessel appears patent beyond that. I would favor this is artifactual, but cannot state that with certainty. If further imaging is desired, MR angiography with contrast could evaluate that location. Atherosclerotic irregularity of the vertebral artery but without flow limiting stenosis. Re- demonstration of sellar enlargement, apparently with a pituitary mass. See previous MRI recommendation. Electronically Signed   By: Nelson Chimes M.D.   On: 08/04/2016 09:40   Dg Chest 2 View  Result  Date: 08/03/2016 CLINICAL DATA:  Transient ischemic attack. EXAM: CHEST  2 VIEW COMPARISON:  Radiograph 07/17/2016 FINDINGS: Low lung volumes accentuating cardiac size and causing bronchovascular crowding. Mild bibasilar atelectasis. No confluent airspace disease, pleural effusion or pneumothorax. Stable osseous structures. IMPRESSION: Lung volumes accentuating the cardiac size and causing bronchovascular crowding. Electronically Signed   By: Jeb Levering M.D.   On: 08/03/2016 23:12   Ct Angio Neck W Or Wo Contrast  Result Date: 08/04/2016 CLINICAL DATA:  Dizziness and difficulty walking over the last 2 weeks. Acute infarctions in the left pons and middle cerebellar peduncle by MRI. EXAM: CT ANGIOGRAPHY HEAD AND NECK TECHNIQUE: Multidetector CT imaging of the head and neck was performed using the standard protocol during bolus administration of intravenous contrast. Multiplanar CT image reconstructions and MIPs were obtained to evaluate the vascular anatomy. Carotid stenosis measurements (when applicable) are obtained utilizing NASCET criteria, using the distal internal carotid diameter as the denominator. CONTRAST:  50 cc Isovue 370 COMPARISON:  MRI 08/03/2016 FINDINGS: CT HEAD FINDINGS Brain: Acute infarctions in the left lateral pons and middle cerebellar peduncle are not specifically  visible by CT. Extensive chronic small-vessel ischemic changes remain evident affecting the pons, thalami I, basal ganglia and throughout the hemispheric white matter. No sign of large vessel territory infarction. No mass lesion, hemorrhage, hydrocephalus or extra-axial collection. Vascular: There is atherosclerotic calcification of the major vessels at the base of the brain. 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 See results of previous MR report and recommendation for pituitary MRI. Orbits: Negative Review of the MIP images confirms the above findings CTA NECK FINDINGS Aortic arch: Aortic  atherosclerosis.  No aneurysm or dissection. Right carotid system: Common carotid artery widely patent to the bifurcation. Carotid bifurcation shows minimal atherosclerotic change in the ICA bulb but no stenosis. Cervical ICA is tortuous but widely patent. Left carotid system: Common carotid artery widely patent to the bifurcation region. Mild atherosclerosis at the distal ICA bulb but no stenosis. Cervical ICA is tortuous but widely patent. Vertebral arteries: Right vertebral artery origin shows a small calcification but no stenosis. Right vertebral artery widely patent through the cervical region. No calcified plaque at the left vertebral artery origin. Question small soft tissue density defect which could simply be artifact because of shoulder density. Cannot rule out small soft plaque or thrombosis of the left vertebral artery origin. Beyond that, the left vertebral artery is widely patent through the cervical region. Skeleton: Mild degenerative spondylosis. 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 No canal stenosis. Other neck: No mass or lymphadenopathy. Upper chest: Mild apical pleural and parenchymal scarring. Review of the MIP images confirms the above findings CTA HEAD FINDINGS Anterior circulation: Both internal carotid arteries are widely patent to the skullbase. There is ordinary siphon atherosclerotic calcification peripherally. No stenosis greater than 50%. Anterior and middle cerebral vessels are patent without proximal stenosis, aneurysm or vascular malformation. Distal vessels show mild to moderate atherosclerotic irregularity. Posterior circulation: Both vertebral arteries are patent at the foramen magnum level. Mild atherosclerotic calcification but no stenosis. Basilar artery is patent, with mild atherosclerotic irregularity. Superior cerebellar and posterior cerebral arteries are widely patent. Venous sinuses: Patent and normal. Anatomic variants: None significant Delayed phase: No abnormal enhancement  Review of the MIP images confirms the above findings IMPRESSION: Mild atherosclerotic disease at the carotid bifurcation regions but without stenosis. Cervical internal carotid artery is tortuous, consistent with the history of hypertension. Carotid siphon atherosclerosis but no stenosis greater than 50%. No intracranial anterior circulation correctable proximal stenosis. Distal vessel atherosclerotic irregularity. Abnormal appearance of the proximal left vertebral artery by CTA. I am not certain if this is due to artifact from shoulder density or if there is a small plaque or thrombus at the left vertebral artery origin. The vessel appears patent beyond that. I would favor this is artifactual, but cannot state that with certainty. If further imaging is desired, MR angiography with contrast could evaluate that location. Atherosclerotic irregularity of the vertebral artery but without flow limiting stenosis. Re- demonstration of sellar enlargement, apparently with a pituitary mass. See previous MRI recommendation. Electronically Signed   By: Nelson Chimes M.D.   On: 08/04/2016 09:40   Mr Jodene Nam Head Wo Contrast  Result Date: 08/03/2016 CLINICAL DATA:  Altered mental status and weakness for 2 days. Recent fall. History of hypertension and diabetes. Assess for stroke. EXAM: MRI HEAD WITHOUT CONTRAST MRA HEAD WITHOUT CONTRAST TECHNIQUE: Multiplanar, multiecho pulse sequences of the brain and surrounding structures were obtained without intravenous contrast. Angiographic images of the head were obtained using MRA technique without contrast. COMPARISON:  None. FINDINGS: MRI  HEAD FINDINGS- mildly motion degraded examination. BRAIN: Patchy reduced diffusion LEFT brachium pontis with low ADC values. No susceptibility artifact to suggest hemorrhage. Faint susceptibility artifact associated with old RIGHT basal ganglia infarct. Old bilateral thalamus and bilateral basal ganglia lacunar infarcts. Patchy to confluent  supratentorial white matter FLAIR T2 hyperintensities. Old LEFT cerebellum and pontine lacunar infarcts. Ventricles and sulci are normal for patient's age. No midline shift, mass effect or masses. No abnormal extra-axial fluid collections. VASCULAR: Major intracranial vascular flow voids present at skull base. SKULL AND UPPER CERVICAL SPINE: Expanded sella with heterogeneous possible mass along the inferior margin. No suspicious calvarial bone marrow signal. Craniocervical junction maintained. SINUSES/ORBITS: Trace paranasal sinus mucosal thickening. Mastoid air cells are well aerated. The included ocular globes and orbital contents are non-suspicious. OTHER: None. MRA HEAD FINDINGS- severe motion through circle of Willis. ANTERIOR CIRCULATION: Normal flow related enhancement of the included cervical, petrous, cavernous and supraclinoid internal carotid arteries. Patent anterior communicating artery. Normal flow related enhancement of the anterior and middle cerebral arteries, including distal segments. Signal dropout through proximal M2 segments due to motion. No large vessel occlusion or aneurysm. POSTERIOR CIRCULATION: RIGHT vertebral artery is dominant. Basilar artery is patent, with normal flow related enhancement of the main branch vessels. Flow related enhancement bilateral posterior cerebral artery's though, signal loss due to motion. No large vessel occlusion or aneurysm. 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 Source images and MIP images were reviewed. IMPRESSION: MRI HEAD: 1. Acute LEFT brachium pontis infarcts. 2. 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 3. Moderate to severe chronic small vessel ischemic disease. 4. 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 MRA HEAD: 1. Limited examination: Severe motion through circle of Willis. 2. No emergent large vessel  occlusion. Electronically Signed   By: Elon Alas M.D.   On: 08/03/2016 17:31   Mr Brain Wo Contrast  Result Date: 08/03/2016 CLINICAL DATA:  Altered mental status and weakness for 2 days. Recent fall. History of hypertension and diabetes. Assess for stroke. EXAM: MRI HEAD WITHOUT CONTRAST MRA HEAD WITHOUT CONTRAST TECHNIQUE: Multiplanar, multiecho pulse sequences of the brain and surrounding structures were obtained without intravenous contrast. Angiographic images of the head were obtained using MRA technique without contrast. COMPARISON:  None. FINDINGS: MRI HEAD FINDINGS- mildly motion degraded examination. BRAIN: Patchy reduced diffusion LEFT brachium pontis with low ADC values. No susceptibility artifact to suggest hemorrhage. Faint susceptibility artifact associated with old RIGHT basal ganglia infarct. Old bilateral thalamus and bilateral basal ganglia lacunar infarcts. Patchy to confluent supratentorial white matter FLAIR T2 hyperintensities. Old LEFT cerebellum and pontine lacunar infarcts. Ventricles and sulci are normal for patient's age. No midline shift, mass effect or masses. No abnormal extra-axial fluid collections. VASCULAR: Major intracranial vascular flow voids present at skull base. SKULL AND UPPER CERVICAL SPINE: Expanded sella with heterogeneous possible mass along the inferior margin. No suspicious calvarial bone marrow signal. Craniocervical junction maintained. SINUSES/ORBITS: Trace paranasal sinus mucosal thickening. Mastoid air cells are well aerated. The included ocular globes and orbital contents are non-suspicious. OTHER: None. MRA HEAD FINDINGS- severe motion through circle of Willis. ANTERIOR CIRCULATION: Normal flow related enhancement of the included cervical, petrous, cavernous and supraclinoid internal carotid arteries. Patent anterior communicating artery. Normal flow related enhancement of the anterior and middle cerebral arteries, including distal segments. Signal  dropout through proximal M2 segments due to motion. No large vessel occlusion or aneurysm. POSTERIOR CIRCULATION:  RIGHT vertebral artery is dominant. Basilar artery is patent, with normal flow related enhancement of the main branch vessels. Flow related enhancement bilateral posterior cerebral artery's though, signal loss due to motion. No large vessel occlusion or aneurysm. 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 Source images and MIP images were reviewed. IMPRESSION: MRI HEAD: 1. Acute LEFT brachium pontis infarcts. 2. 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 3. Moderate to severe chronic small vessel ischemic disease. 4. 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 MRA HEAD: 1. Limited examination: Severe motion through circle of Willis. 2. No emergent large vessel occlusion. Electronically Signed   By: Elon Alas M.D.   On: 08/03/2016 17:31   Mr Jeri Cos UY Contrast  Result Date: 08/04/2016 CLINICAL DATA:  Pituitary mass EXAM: MRI HEAD WITHOUT AND WITH CONTRAST TECHNIQUE: Multiplanar, multiecho pulse sequences of the brain and surrounding structures were obtained without and with intravenous contrast. CONTRAST:  11m MULTIHANCE GADOBENATE DIMEGLUMINE 529 MG/ML IV SOLN COMPARISON:  Head CT 08/04/2016 Brain MRI 08/03/2016 FINDINGS: Pituitary/Sella: The sella is expanded by a a bilobed, intrasellar, contrast-enhancing mass that measures 1.8 x 1.3 x 1.2 cm ( (AP x Transverse x CC)). The mass shows slightly decreased contrast enhancement relative to the normal gland parenchyma. There is minimal leftward deviation of the infundibulum. There is no mass effect on the optic chiasm. The hypothalamus and mamillary bodies are normal. No extension into the cavernous sinus. Brain: Small focus of diffusion restriction again seen in the left middle cerebellar peduncle, increased in  size slightly. There are old infarcts of the left midbrain and right pons, as well as multiple old basal ganglia lacunar infarcts bilaterally. There is cytotoxic edema within the left middle cerebellar peduncle. Otherwise, there is early confluent hyperintense T2 weighted signal within the periventricular, subcortical and deep white matter. No midline shift or mass effect. No acute or chronic hemorrhage. No hydrocephalus, age advanced atrophy or lobar predominant volume loss. No dural abnormality or extra-axial collection. Vascular: Major intracranial arterial and venous sinus flow voids are preserved. Skull and upper cervical spine: The visualized skull base, calvarium, upper cervical spine and extracranial soft tissues are normal. Sinuses/Orbits: No fluid levels or advanced mucosal thickening. No mastoid effusion. Normal orbits. IMPRESSION: 1. 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 2. Bilobed, contrast-enhancing intrasellar mass, most consistent with pituitary macroadenoma. No mass effect on the optic chiasm. Electronically Signed   By: KUlyses JarredM.D.   On: 08/04/2016 22:47    echocardiogram  LV EF: 60% -   65%  ------------------------------------------------------------------- Indications:      CVA 436.  ------------------------------------------------------------------- History:   PMH:  Chronic Kidney Disease.  Risk factors: Hypertension. Diabetes mellitus.  ------------------------------------------------------------------- Study Conclusions  - Left ventricle: The cavity size was normal. There was moderate   concentric hypertrophy. Systolic function was normal. The   estimated ejection fraction was in the range of 60% to 65%. Wall   motion was normal; there were no regional wall motion   abnormalities. Doppler parameters are consistent with abnormal   left ventricular relaxation (grade 1 diastolic  dysfunction). The   E/e&' ratio is between 8-15, suggesting indeterminate LV filling   pressure. - Aortic valve: Mildly calcified leaflets. Mild stenosis. Mean   gradient (S): 12 mm Hg. Peak gradient (S): 23 mm Hg. Valve area   (Vmax): 1.95 cm^2.  Valve area (Vmean): 1.99 cm^2. - Mitral valve: Calcified annulus. Mildly thickened leaflets .   There was trivial regurgitation. - Left atrium: The atrium was normal in size. - Inferior vena cava: The vessel was normal in size. The   respirophasic diameter changes were in the normal range (>= 50%),   consistent with normal central venous pressure.  Impressions:  - LVEF 60-65%, moderate LVH, normal wall motion, grade 1 DD,   indeterminate LV filling pressure, mild aortic stenosis - AVA   around 2.0 cm2, MAC with trivial MR, normal LA size, normal IVC.       Filed Weights   08/03/16 1451 08/03/16 2154  Weight: 108 kg (238 lb) 107 kg (235 lb 12.8 oz)     Microbiology: Recent Results (from the past 240 hour(s))  Urine Culture     Status: Abnormal   Collection Time: 08/03/16  5:39 PM  Result Value Ref Range Status   Specimen Description URINE, CLEAN CATCH  Final   Special Requests NONE  Final   Culture (A)  Final    <10,000 COLONIES/mL INSIGNIFICANT GROWTH Performed at [facility], [address] [place], [place] [address]    Report Status 08/05/2016 FINAL  Final  Blood culture (routine x 2)     Status: None (Preliminary result)   Collection Time: 08/03/16  5:50 PM  Result Value Ref Range Status   Specimen Description LEFT ANTECUBITAL  Final   Special Requests   Final    BOTTLES DRAWN AEROBIC AND ANAEROBIC Blood Culture adequate volume   Culture NO GROWTH 3 DAYS  Final   Report Status PENDING  Incomplete  Blood culture (routine x 2)     Status: None (Preliminary result)   Collection Time: 08/03/16  5:50 PM  Result Value Ref Range Status   Specimen Description BLOOD LEFT HAND  Final   Special Requests   Final     BOTTLES DRAWN AEROBIC AND ANAEROBIC Blood Culture adequate volume   Culture NO GROWTH 3 DAYS  Final   Report Status PENDING  Incomplete       Blood Culture    Component Value Date/Time   SDES LEFT ANTECUBITAL 08/03/2016 1750   SDES BLOOD LEFT HAND 08/03/2016 1750   SPECREQUEST  08/03/2016 1750    BOTTLES DRAWN AEROBIC AND ANAEROBIC Blood Culture adequate volume   SPECREQUEST  08/03/2016 1750    BOTTLES DRAWN AEROBIC AND ANAEROBIC Blood Culture adequate volume   CULT NO GROWTH 3 DAYS 08/03/2016 1750   CULT NO GROWTH 3 DAYS 08/03/2016 1750   REPTSTATUS PENDING 08/03/2016 1750   REPTSTATUS PENDING 08/03/2016 1750      Labs: Results for orders placed or performed during the hospital encounter of 08/03/16 (from the past 48 hour(s))  Glucose, capillary     Status: Abnormal   Collection Time: 08/04/16 11:18 AM  Result Value Ref Range   Glucose-Capillary 195 (H) 65 - 99 mg/dL   Comment 1 Notify RN    Comment 2 Document in Chart   Glucose, capillary     Status: None   Collection Time: 08/04/16  5:04 PM  Result Value Ref Range   Glucose-Capillary 94 65 - 99 mg/dL   Comment 1 Notify RN    Comment 2 Document in Chart   Glucose, capillary     Status: Abnormal   Collection Time: 08/04/16 10:16 PM  Result Value Ref Range   Glucose-Capillary 109 (H) 65 - 99 mg/dL   Comment 1 Notify RN  Comment 2 Document in Chart   Basic metabolic panel     Status: Abnormal   Collection Time: 08/05/16  4:24 AM  Result Value Ref Range   Sodium 138 135 - 145 mmol/L   Potassium 3.7 3.5 - 5.1 mmol/L   Chloride 107 101 - 111 mmol/L   CO2 23 22 - 32 mmol/L   Glucose, Bld 116 (H) 65 - 99 mg/dL   BUN 18 6 - 20 mg/dL   Creatinine, Ser 1.49 (H) 0.61 - 1.24 mg/dL   Calcium 8.4 (L) 8.9 - 10.3 mg/dL   GFR calc non Af Amer 48 (L) >60 mL/min   GFR calc Af Amer 55 (L) >60 mL/min    Comment: (NOTE) The eGFR has been calculated using the CKD EPI equation. This calculation has not been validated in all  clinical situations. eGFR's persistently <60 mL/min signify possible Chronic Kidney Disease.    Anion gap 8 5 - 15  Glucose, capillary     Status: Abnormal   Collection Time: 08/05/16  6:09 AM  Result Value Ref Range   Glucose-Capillary 113 (H) 65 - 99 mg/dL   Comment 1 Notify RN    Comment 2 Document in Chart   Glucose, capillary     Status: Abnormal   Collection Time: 08/05/16 11:42 AM  Result Value Ref Range   Glucose-Capillary 160 (H) 65 - 99 mg/dL   Comment 1 Notify RN    Comment 2 Document in Chart   Glucose, capillary     Status: Abnormal   Collection Time: 08/05/16  4:36 PM  Result Value Ref Range   Glucose-Capillary 109 (H) 65 - 99 mg/dL   Comment 1 Notify RN    Comment 2 Document in Chart   Glucose, capillary     Status: Abnormal   Collection Time: 08/05/16  9:18 PM  Result Value Ref Range   Glucose-Capillary 112 (H) 65 - 99 mg/dL   Comment 1 Notify RN    Comment 2 Document in Chart   Glucose, capillary     Status: Abnormal   Collection Time: 08/06/16  6:13 AM  Result Value Ref Range   Glucose-Capillary 111 (H) 65 - 99 mg/dL   Comment 1 Notify RN    Comment 2 Document in Chart      Lipid Panel     Component Value Date/Time   CHOL 123 08/04/2016 0338   TRIG 122 08/04/2016 0338   HDL 29 (L) 08/04/2016 0338   CHOLHDL 4.2 08/04/2016 0338   VLDL 24 08/04/2016 0338   LDLCALC 70 08/04/2016 0338     Lab Results  Component Value Date   HGBA1C 6.5 (H) 08/03/2016     Lab Results  Component Value Date   LDLCALC 70 08/04/2016   CREATININE 1.49 (H) 08/05/2016     HPI :  66 y.o. right handed male with history of CKD stage III, hypertension, diabetes mellitus and tobacco abuse. History taken from chart review and patient. Patient lives with aunt and uncle. Independent prior to admission working full time. Two-level home with bedroom downstairs and multiple entry steps. He also has a girlfriend and daughter in the area. Presented 08/03/2016 with  dizziness/diplopia and unsteady gait 2 weeks waxing and waning as well as previous fall. MRI brain reviewed, showing left cerebellar CVA. Per report, acute left brachium pontis infarcts. 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 Moderate to severe chronic small vessel ischemic disease. MRA with no emergent large vessel occlusion. Patient did  not receive TPA. Echocardiogram with ejection fraction of 39% grade 1 diastolic dysfunction. CT angiogram of head and neck showed mild atherosclerotic disease of the carotid bifurcation but without stenosis. 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 Currently maintained on aspirin for CVA prophylaxis   HOSPITAL COURSE: *  CVA (cerebral vascular accident)  - MRI >>  Left pontine ischemic infarct- affecting vision and gait - MRA>> Old basal ganglia, thalamus, pontine and LEFT cerebellar Infarcts, Moderate to severe chronic small vessel ischemic disease, suspicious for sellar mass- recommending MRI sella protocol with contrast is recommended on a nonemergent basis. - CTA head and neck: mil atherosclerotic disease- see report below - Lipid panel- LDL 70, HDL 29-continue Lipitor - 2 D ECHO- no thrombus noted - A1c 6.5  - Aspirin 325  Recommended by neurology - passed SLP eval - OT recommendations >> CIR, eye patch to alternate between eyes Q 2 hrs - social work has spoken with family- if he is declined by [facility], he will go home with family as they are not interested in SNF.    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 Outpatient Neurosurgery  referral has been provided    Cough/ yellow sputum in a cigarette smoker - started about 3 days ago- likely has acute viral bronchitis- no wheezing at this time and so no steroids needed - Continue Nebs  QID- hold off on antibiotic for now and follow - need outpt eval for COPD with PFTs - needs to quit smoking    Hypokalemia - K - 2.6>> 3.4>3.7 - Mg normal at 2.2    Mild elevated troponin - 0.11>>  0.11 and then < 0.03 - EKG reviewed- unrevealing - likely due to CKD- no recent chest pain- DOE may be due to above respiratory infection - ECHO shows normal wall motion    CKD (chronic kidney disease), stage III - stable    Diabetes mellitus type 2 in obese (HCC)    Accu-Chek stable - cont SSI - Metformin held due to renal insufficiency    Essential hypertension -Continue Bystolic  - hold losartan/ HCTZ pending improvement in renal function, resume in one to 2 weeks   BPH - Cardura resumed on admission   Pyruia - in setting of BPH - asymptomatic - hold off on treating, follow urine culture   Obesity, Body mass index is 35.85 kg/m., recommend weight loss, diet and exercise as appropriate     Discharge Exam:  Blood pressure 132/89, pulse 82, temperature 98.6 F (37 C), temperature source Oral, resp. rate 20, height _0  (1.727 m), weight 107 kg (235 lb 12.8 oz), SpO2 98 %.  General exam: Appears comfortable  HEENT: PERRLA, oral mucosa moist, no sclera icterus or thrush Respiratory system: Clear to auscultation. Respiratory effort normal. Cardiovascular system: S1 & S2 heard, RRR.  No murmurs  Gastrointestinal system: Abdomen soft, non-tender, nondistended. Normal bowel sound. No organomegaly Central nervous system: Alert and oriented. No focal neurological deficits.    Follow-up Information    Dennie Bible, NP. Schedule an appointment as soon as possible for a visit in 6 week(s).   Specialty:  Family Medicine Why:  stroke follow up Contact information: [address] [place] [address] [phone]           Signed: Reyne Dumas 08/06/2016, 10:09 AM  Time spent >1 hour  

## 2016-08-06 NOTE — Clinical Social Work Note (Signed)
Clinical Social Work Assessment  Patient Details  Name: Blake Moran MRN: [id number] Date of Birth: [birth year]  Date of referral:  08/06/16               Reason for consult:  Facility Placement, Discharge Planning                Permission sought to share information with:  Facility Sport and exercise psychologist, Family Supports Permission granted to share information::  Yes, Verbal Permission Granted  Name::     Biochemist, clinical::  SNF  Relationship::  Girlfriend  Contact Information:     Housing/Transportation Living arrangements for the past 2 months:  Apartment Source of Information:  Patient, Partner Patient Interpreter Needed:  None Criminal Activity/Legal Involvement Pertinent to Current Situation/Hospitalization:  No - Comment as needed Significant Relationships:  Significant Other, Adult Children Lives with:  Self Do you feel safe going back to the place where you live?  Yes Need for family participation in patient care:  No (Coment)  Care giving concerns:  Patient currently resides alone, and patient and family have realized that the patient can't safely return home without receiving rehab first.   Facilities manager / plan:  CSW met with patient and girlfriend at bedside to discuss update received on the patient wanting a rehab back-up in case CIR isn't an option. Patient and patient's girlfriend requested placement at [facility], if possible. CSW explained role and will follow to facilitate discharge.  Employment status:  Retired Advertising copywriter PT Recommendations:  Inpatient [place] / Referral to community resources:  [facility]  Patient/Family's Response to care:  Patient agreeable to SNF placement.  Patient/Family's Understanding of and Emotional Response to Diagnosis, Current Treatment, and Prognosis:  Patient and patient's girlfriend seem to understand the patient's current diagnosis and mobility needs and  concerns. Patient and patient's girlfriend indicated understanding of CSW role in discharge planning.  Emotional Assessment Appearance:  Appears stated age Attitude/Demeanor/Rapport:    Affect (typically observed):  Appropriate Orientation:  Oriented to Situation, Oriented to  Time, Oriented to Place, Oriented to Self Alcohol / Substance use:  Not Applicable Psych involvement (Current and /or in the community):  No (Comment)  Discharge Needs  Concerns to be addressed:  Care Coordination, Discharge Planning Concerns Readmission within the last 30 days:  No Current discharge risk:  Lives alone, Physical Impairment Barriers to Discharge:  Continued Medical Work up, [place], [place] 08/06/2016, 4:40 PM

## 2016-08-06 NOTE — Progress Notes (Signed)
No word back from insurance for admission to CIR. CM met with the patient to discuss the possibility of going home with [facility] services. Per patient and his girl friend he lives below his aunt that can not provide him assistance. He has 6 steps to get down to his part of the home. He states he does not have someone to assist him as needed at home. PT has not done steps with the patient. Per PT note patient is at risk for falls and has a staggering gait at times.  Patient feels he needs rehab prior to going home. CM notified CSW and MD.

## 2016-08-06 NOTE — Progress Notes (Signed)
Rehab admissions - I have opened the case with [facility] and have submitted clinicals requesting acute inpatient rehab admission.  I met with patient, his daughter and his aunt at the bedside.  I will follow up with all once I hear back from insurance case manager.  Call me for questions.  #[phone]

## 2016-08-07 LAB — GLUCOSE, CAPILLARY
GLUCOSE-CAPILLARY: 97 mg/dL (ref 65–99)
GLUCOSE-CAPILLARY: 103 mg/dL — AB (ref 65–99)
Glucose-Capillary: 108 mg/dL — ABNORMAL HIGH (ref 65–99)

## 2016-08-07 MED ORDER — ASPIRIN EC 81 MG PO TBEC: 81.0000 mg | DELAYED_RELEASE_TABLET | Freq: Every day | ORAL | Status: DC

## 2016-08-07 MED ORDER — ASPIRIN EC 325 MG PO TBEC
325.0000 mg | DELAYED_RELEASE_TABLET | Freq: Every day | ORAL | Status: DC
  Administered 2016-08-07: 325 mg via ORAL
  Filled 2016-08-07: qty 1

## 2016-08-07 MED ORDER — GLIPIZIDE 5 MG PO TABS: 2.5000 mg | ORAL_TABLET | Freq: Every day | ORAL | 11 refills | Status: DC

## 2016-08-07 NOTE — Progress Notes (Signed)
CSW sent referral for SNF to [facility], and discussed insurance authorization. [facility] representative informed CSW that [employer] will not authorize a rehab placement if the patient has been walking 350 feet with physical therapy in the hospital.   CSW alerted RNCM that patient will not qualify for SNF placement if CIR does not admit. CSW signing off.  Laveda Abbe, [place] Clinical Social Worker [phone]

## 2016-08-07 NOTE — Discharge Summary (Addendum)
Physician Discharge Summary  Blake Moran MRN: [id number] DOB/AGE: July 31, 1950 66 y.o.  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   Essential hypertension   Dizziness   Elevated serum creatinine   Urinary tract infection without hematuria   Pituitary mass ([facility])   Vertigo   Morbid obesity (HCC)   Diastolic dysfunction   Benign essential HTN   Acute blood loss anemia   Leukocytosis   Hyperlipidemia    Follow-up recommendations Follow-up with PCP in 3-5 days , including all  additional recommended appointments as below Follow-up CBC, CMP in 3-5 days Metformin discontinued due to renal insufficiency Resume antihypertensive medications in 1-2 weeks if renal function remained stable and monitor electrolytes and renal function closely Pt will follow up with Cecille Rubin NP at [facility] in about 6 weeks 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 Qty: 30 tablet, Refills: 2    glipiZIDE (GLUCOTROL) 5 MG tablet Take 0.5 tablets (2.5 mg total) by mouth daily before breakfast. Qty: 60 tablet, Refills: 11    senna-docusate (SENOKOT-S) 8.6-50 MG tablet Take 1 tablet by mouth at bedtime as needed for mild constipation. Qty: 30 tablet, Refills: 1      CONTINUE these medications which have CHANGED   Details  aspirin EC 325 MG tablet Take 1 tablet (325 mg total) by mouth daily. Qty: 30 tablet, Refills: 3    losartan-hydrochlorothiazide (HYZAAR) 100-12.5 MG tablet Take 1  tablet by mouth daily. Qty: 30 tablet, Refills: 0      CONTINUE these medications which have NOT CHANGED   Details  doxazosin (CARDURA) 8 MG tablet Take 8 mg by mouth at bedtime.    Nebivolol HCl (BYSTOLIC) 20 MG TABS Take 20 mg by mouth daily.    promethazine (PHENERGAN) 12.5 MG tablet Take 12.5 mg by mouth every 6 (six) hours as needed for nausea or vomiting.    Vitamin D, Ergocalciferol, (DRISDOL) 50000 units CAPS capsule Take 50,000 Units by mouth every Tuesday.      STOP taking these medications     metFORMIN (GLUCOPHAGE) 500 MG tablet          Discharge Condition:  Stable   Discharge Instructions Get Medicines reviewed and adjusted: Please take all your medications with you for your next visit with your Primary MD  Please request your Primary MD to go over all hospital tests and procedure/radiological results at the follow up, please ask your Primary MD to get all Hospital records sent to his/her office.  If you experience worsening of your admission symptoms, develop shortness of breath, life threatening emergency, suicidal or homicidal thoughts you must seek medical attention immediately by calling 911 or calling your MD immediately if symptoms less severe.  You must read complete instructions/literature along with all the possible adverse reactions/side effects for all the Medicines you take and that have been prescribed to you. Take any new Medicines after you have completely understood and accpet all the possible adverse reactions/side effects.  Do not drive when taking Pain medications.   Do not take more than prescribed Pain, Sleep and Anxiety Medications  Special Instructions: If you have smoked or chewed Tobacco in the last 2 yrs please stop smoking, stop any regular Alcohol and or any Recreational drug use.  Wear Seat belts while driving.  Please note  You were cared for by a hospitalist during your hospital stay. Once you are discharged, your primary  care physician will handle any further medical issues. Please note that NO REFILLS for any discharge medications will be authorized once you are discharged, as it is imperative that you return to your primary care physician (or establish a relationship with a primary care physician if you do not have one) for your aftercare needs so that they can reassess your need for medications and monitor your lab values.  Discharge Instructions    Ambulatory referral to Neurology    Complete by:  As directed    Follow up with stroke clinic Cecille Rubin preferred, if not available, then consider Caesar Chestnut, [facility] or Jaynee Eagles whoever is available) at [facility] in about 6-8 weeks. Thanks.   Ambulatory referral to Neurosurgery    Complete by:  As directed    Sellar mass   Diet - low sodium heart healthy    Complete by:  As directed    Diet - low sodium heart healthy    Complete by:  As directed    Diet - low sodium heart healthy    Complete by:  As directed    Increase activity slowly    Complete by:  As directed    Increase activity slowly    Complete by:  As directed    Increase activity slowly    Complete by:  As directed        Allergies  Allergen Reactions  . Lisinopril Swelling      Disposition: CIR   Consults:  Neurology    Significant Diagnostic Studies:  Ct Angio Head W Or Wo Contrast  Result Date: 08/04/2016 CLINICAL DATA:  Dizziness and difficulty walking over the last 2 weeks. Acute infarctions in the left pons and middle cerebellar peduncle by MRI. EXAM: CT ANGIOGRAPHY HEAD AND NECK TECHNIQUE: Multidetector CT imaging of the head and neck was performed using the standard protocol during bolus administration of intravenous contrast. Multiplanar CT image reconstructions and MIPs were obtained to evaluate the vascular anatomy. Carotid stenosis measurements (when applicable) are obtained utilizing NASCET criteria, using the distal internal carotid diameter as the denominator.  CONTRAST:  50 cc Isovue 370 COMPARISON:  MRI 08/03/2016 FINDINGS: CT HEAD FINDINGS Brain: Acute infarctions in the left lateral pons and middle cerebellar peduncle are not specifically visible by CT. Extensive chronic small-vessel ischemic changes remain evident affecting the pons, thalami I, basal ganglia and throughout the hemispheric white matter. No sign of large vessel territory infarction. No mass lesion, hemorrhage, hydrocephalus or extra-axial collection. Vascular: There is atherosclerotic calcification of the major vessels at the base of the brain. 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 See results of previous MR report and recommendation for pituitary MRI. Orbits: Negative Review of the MIP images confirms the above findings CTA NECK FINDINGS Aortic arch: Aortic atherosclerosis.  No aneurysm or dissection. Right carotid system: Common carotid artery widely patent to the bifurcation. Carotid bifurcation shows minimal atherosclerotic change in the ICA bulb but no stenosis. Cervical ICA is tortuous but widely patent. Left carotid system: Common carotid artery widely patent to  the bifurcation region. Mild atherosclerosis at the distal ICA bulb but no stenosis. Cervical ICA is tortuous but widely patent. Vertebral arteries: Right vertebral artery origin shows a small calcification but no stenosis. Right vertebral artery widely patent through the cervical region. No calcified plaque at the left vertebral artery origin. Question small soft tissue density defect which could simply be artifact because of shoulder density. Cannot rule out small soft plaque or thrombosis of the left vertebral artery origin. Beyond that, the left vertebral artery is widely patent through the cervical region. Skeleton: Mild degenerative spondylosis. 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 No canal stenosis. Other neck: No mass or lymphadenopathy. Upper chest: Mild apical pleural and parenchymal scarring.  Review of the MIP images confirms the above findings CTA HEAD FINDINGS Anterior circulation: Both internal carotid arteries are widely patent to the skullbase. There is ordinary siphon atherosclerotic calcification peripherally. No stenosis greater than 50%. Anterior and middle cerebral vessels are patent without proximal stenosis, aneurysm or vascular malformation. Distal vessels show mild to moderate atherosclerotic irregularity. Posterior circulation: Both vertebral arteries are patent at the foramen magnum level. Mild atherosclerotic calcification but no stenosis. Basilar artery is patent, with mild atherosclerotic irregularity. Superior cerebellar and posterior cerebral arteries are widely patent. Venous sinuses: Patent and normal. Anatomic variants: None significant Delayed phase: No abnormal enhancement Review of the MIP images confirms the above findings IMPRESSION: Mild atherosclerotic disease at the carotid bifurcation regions but without stenosis. Cervical internal carotid artery is tortuous, consistent with the history of hypertension. Carotid siphon atherosclerosis but no stenosis greater than 50%. No intracranial anterior circulation correctable proximal stenosis. Distal vessel atherosclerotic irregularity. Abnormal appearance of the proximal left vertebral artery by CTA. I am not certain if this is due to artifact from shoulder density or if there is a small plaque or thrombus at the left vertebral artery origin. The vessel appears patent beyond that. I would favor this is artifactual, but cannot state that with certainty. If further imaging is desired, MR angiography with contrast could evaluate that location. Atherosclerotic irregularity of the vertebral artery but without flow limiting stenosis. Re- demonstration of sellar enlargement, apparently with a pituitary mass. See previous MRI recommendation. Electronically Signed   By: Nelson Chimes M.D.   On: 08/04/2016 09:40   Dg Chest 2 View  Result  Date: 08/03/2016 CLINICAL DATA:  Transient ischemic attack. EXAM: CHEST  2 VIEW COMPARISON:  Radiograph 07/17/2016 FINDINGS: Low lung volumes accentuating cardiac size and causing bronchovascular crowding. Mild bibasilar atelectasis. No confluent airspace disease, pleural effusion or pneumothorax. Stable osseous structures. IMPRESSION: Lung volumes accentuating the cardiac size and causing bronchovascular crowding. Electronically Signed   By: Jeb Levering M.D.   On: 08/03/2016 23:12   Ct Angio Neck W Or Wo Contrast  Result Date: 08/04/2016 CLINICAL DATA:  Dizziness and difficulty walking over the last 2 weeks. Acute infarctions in the left pons and middle cerebellar peduncle by MRI. EXAM: CT ANGIOGRAPHY HEAD AND NECK TECHNIQUE: Multidetector CT imaging of the head and neck was performed using the standard protocol during bolus administration of intravenous contrast. Multiplanar CT image reconstructions and MIPs were obtained to evaluate the vascular anatomy. Carotid stenosis measurements (when applicable) are obtained utilizing NASCET criteria, using the distal internal carotid diameter as the denominator. CONTRAST:  50 cc Isovue 370 COMPARISON:  MRI 08/03/2016 FINDINGS: CT HEAD FINDINGS Brain: Acute infarctions in the left lateral pons and middle cerebellar peduncle are not specifically visible by CT. Extensive chronic small-vessel ischemic changes remain evident affecting  the pons, thalami I, basal ganglia and throughout the hemispheric white matter. No sign of large vessel territory infarction. No mass lesion, hemorrhage, hydrocephalus or extra-axial collection. Vascular: There is atherosclerotic calcification of the major vessels at the base of the brain. 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 See results of previous MR report and recommendation for pituitary MRI. Orbits: Negative Review of the MIP images confirms the above findings CTA NECK FINDINGS Aortic arch: Aortic  atherosclerosis.  No aneurysm or dissection. Right carotid system: Common carotid artery widely patent to the bifurcation. Carotid bifurcation shows minimal atherosclerotic change in the ICA bulb but no stenosis. Cervical ICA is tortuous but widely patent. Left carotid system: Common carotid artery widely patent to the bifurcation region. Mild atherosclerosis at the distal ICA bulb but no stenosis. Cervical ICA is tortuous but widely patent. Vertebral arteries: Right vertebral artery origin shows a small calcification but no stenosis. Right vertebral artery widely patent through the cervical region. No calcified plaque at the left vertebral artery origin. Question small soft tissue density defect which could simply be artifact because of shoulder density. Cannot rule out small soft plaque or thrombosis of the left vertebral artery origin. Beyond that, the left vertebral artery is widely patent through the cervical region. Skeleton: Mild degenerative spondylosis. 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 No canal stenosis. Other neck: No mass or lymphadenopathy. Upper chest: Mild apical pleural and parenchymal scarring. Review of the MIP images confirms the above findings CTA HEAD FINDINGS Anterior circulation: Both internal carotid arteries are widely patent to the skullbase. There is ordinary siphon atherosclerotic calcification peripherally. No stenosis greater than 50%. Anterior and middle cerebral vessels are patent without proximal stenosis, aneurysm or vascular malformation. Distal vessels show mild to moderate atherosclerotic irregularity. Posterior circulation: Both vertebral arteries are patent at the foramen magnum level. Mild atherosclerotic calcification but no stenosis. Basilar artery is patent, with mild atherosclerotic irregularity. Superior cerebellar and posterior cerebral arteries are widely patent. Venous sinuses: Patent and normal. Anatomic variants: None significant Delayed phase: No abnormal enhancement  Review of the MIP images confirms the above findings IMPRESSION: Mild atherosclerotic disease at the carotid bifurcation regions but without stenosis. Cervical internal carotid artery is tortuous, consistent with the history of hypertension. Carotid siphon atherosclerosis but no stenosis greater than 50%. No intracranial anterior circulation correctable proximal stenosis. Distal vessel atherosclerotic irregularity. Abnormal appearance of the proximal left vertebral artery by CTA. I am not certain if this is due to artifact from shoulder density or if there is a small plaque or thrombus at the left vertebral artery origin. The vessel appears patent beyond that. I would favor this is artifactual, but cannot state that with certainty. If further imaging is desired, MR angiography with contrast could evaluate that location. Atherosclerotic irregularity of the vertebral artery but without flow limiting stenosis. Re- demonstration of sellar enlargement, apparently with a pituitary mass. See previous MRI recommendation. Electronically Signed   By: Nelson Chimes M.D.   On: 08/04/2016 09:40   Mr Jodene Nam Head Wo Contrast  Result Date: 08/03/2016 CLINICAL DATA:  Altered mental status and weakness for 2 days. Recent fall. History of hypertension and diabetes. Assess for stroke. EXAM: MRI HEAD WITHOUT CONTRAST MRA HEAD WITHOUT CONTRAST TECHNIQUE: Multiplanar, multiecho pulse sequences of the brain and surrounding structures were obtained without intravenous contrast. Angiographic images of the head were obtained using MRA technique without contrast. COMPARISON:  None. FINDINGS: MRI HEAD FINDINGS- mildly motion degraded examination. BRAIN: Patchy reduced diffusion LEFT  brachium pontis with low ADC values. No susceptibility artifact to suggest hemorrhage. Faint susceptibility artifact associated with old RIGHT basal ganglia infarct. Old bilateral thalamus and bilateral basal ganglia lacunar infarcts. Patchy to confluent  supratentorial white matter FLAIR T2 hyperintensities. Old LEFT cerebellum and pontine lacunar infarcts. Ventricles and sulci are normal for patient's age. No midline shift, mass effect or masses. No abnormal extra-axial fluid collections. VASCULAR: Major intracranial vascular flow voids present at skull base. SKULL AND UPPER CERVICAL SPINE: Expanded sella with heterogeneous possible mass along the inferior margin. No suspicious calvarial bone marrow signal. Craniocervical junction maintained. SINUSES/ORBITS: Trace paranasal sinus mucosal thickening. Mastoid air cells are well aerated. The included ocular globes and orbital contents are non-suspicious. OTHER: None. MRA HEAD FINDINGS- severe motion through circle of Willis. ANTERIOR CIRCULATION: Normal flow related enhancement of the included cervical, petrous, cavernous and supraclinoid internal carotid arteries. Patent anterior communicating artery. Normal flow related enhancement of the anterior and middle cerebral arteries, including distal segments. Signal dropout through proximal M2 segments due to motion. No large vessel occlusion or aneurysm. POSTERIOR CIRCULATION: RIGHT vertebral artery is dominant. Basilar artery is patent, with normal flow related enhancement of the main branch vessels. Flow related enhancement bilateral posterior cerebral artery's though, signal loss due to motion. No large vessel occlusion or aneurysm. 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 Source images and MIP images were reviewed. IMPRESSION: MRI HEAD: 1. Acute LEFT brachium pontis infarcts. 2. 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 3. Moderate to severe chronic small vessel ischemic disease. 4. 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 MRA HEAD: 1. Limited examination: Severe motion through circle of Willis. 2. No emergent large vessel  occlusion. Electronically Signed   By: Elon Alas M.D.   On: 08/03/2016 17:31   Mr Brain Wo Contrast  Result Date: 08/03/2016 CLINICAL DATA:  Altered mental status and weakness for 2 days. Recent fall. History of hypertension and diabetes. Assess for stroke. EXAM: MRI HEAD WITHOUT CONTRAST MRA HEAD WITHOUT CONTRAST TECHNIQUE: Multiplanar, multiecho pulse sequences of the brain and surrounding structures were obtained without intravenous contrast. Angiographic images of the head were obtained using MRA technique without contrast. COMPARISON:  None. FINDINGS: MRI HEAD FINDINGS- mildly motion degraded examination. BRAIN: Patchy reduced diffusion LEFT brachium pontis with low ADC values. No susceptibility artifact to suggest hemorrhage. Faint susceptibility artifact associated with old RIGHT basal ganglia infarct. Old bilateral thalamus and bilateral basal ganglia lacunar infarcts. Patchy to confluent supratentorial white matter FLAIR T2 hyperintensities. Old LEFT cerebellum and pontine lacunar infarcts. Ventricles and sulci are normal for patient's age. No midline shift, mass effect or masses. No abnormal extra-axial fluid collections. VASCULAR: Major intracranial vascular flow voids present at skull base. SKULL AND UPPER CERVICAL SPINE: Expanded sella with heterogeneous possible mass along the inferior margin. No suspicious calvarial bone marrow signal. Craniocervical junction maintained. SINUSES/ORBITS: Trace paranasal sinus mucosal thickening. Mastoid air cells are well aerated. The included ocular globes and orbital contents are non-suspicious. OTHER: None. MRA HEAD FINDINGS- severe motion through circle of Willis. ANTERIOR CIRCULATION: Normal flow related enhancement of the included cervical, petrous, cavernous and supraclinoid internal carotid arteries. Patent anterior communicating artery. Normal flow related enhancement of the anterior and middle cerebral arteries, including distal segments. Signal  dropout through proximal M2 segments due to motion. No large vessel occlusion or aneurysm. POSTERIOR CIRCULATION: RIGHT vertebral artery is dominant. Basilar artery is patent, with normal  flow related enhancement of the main branch vessels. Flow related enhancement bilateral posterior cerebral artery's though, signal loss due to motion. No large vessel occlusion or aneurysm. 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 Source images and MIP images were reviewed. IMPRESSION: MRI HEAD: 1. Acute LEFT brachium pontis infarcts. 2. 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 3. Moderate to severe chronic small vessel ischemic disease. 4. 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 MRA HEAD: 1. Limited examination: Severe motion through circle of Willis. 2. No emergent large vessel occlusion. Electronically Signed   By: Elon Alas M.D.   On: 08/03/2016 17:31   Mr Jeri Cos GM Contrast  Result Date: 08/04/2016 CLINICAL DATA:  Pituitary mass EXAM: MRI HEAD WITHOUT AND WITH CONTRAST TECHNIQUE: Multiplanar, multiecho pulse sequences of the brain and surrounding structures were obtained without and with intravenous contrast. CONTRAST:  73m MULTIHANCE GADOBENATE DIMEGLUMINE 529 MG/ML IV SOLN COMPARISON:  Head CT 08/04/2016 Brain MRI 08/03/2016 FINDINGS: Pituitary/Sella: The sella is expanded by a a bilobed, intrasellar, contrast-enhancing mass that measures 1.8 x 1.3 x 1.2 cm ( (AP x Transverse x CC)). The mass shows slightly decreased contrast enhancement relative to the normal gland parenchyma. There is minimal leftward deviation of the infundibulum. There is no mass effect on the optic chiasm. The hypothalamus and mamillary bodies are normal. No extension into the cavernous sinus. Brain: Small focus of diffusion restriction again seen in the left middle cerebellar peduncle, increased in  size slightly. There are old infarcts of the left midbrain and right pons, as well as multiple old basal ganglia lacunar infarcts bilaterally. There is cytotoxic edema within the left middle cerebellar peduncle. Otherwise, there is early confluent hyperintense T2 weighted signal within the periventricular, subcortical and deep white matter. No midline shift or mass effect. No acute or chronic hemorrhage. No hydrocephalus, age advanced atrophy or lobar predominant volume loss. No dural abnormality or extra-axial collection. Vascular: Major intracranial arterial and venous sinus flow voids are preserved. Skull and upper cervical spine: The visualized skull base, calvarium, upper cervical spine and extracranial soft tissues are normal. Sinuses/Orbits: No fluid levels or advanced mucosal thickening. No mastoid effusion. Normal orbits. IMPRESSION: 1. 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 2. Bilobed, contrast-enhancing intrasellar mass, most consistent with pituitary macroadenoma. No mass effect on the optic chiasm. Electronically Signed   By: KUlyses JarredM.D.   On: 08/04/2016 22:47    echocardiogram  LV EF: 60% -   65%  ------------------------------------------------------------------- Indications:      CVA 436.  ------------------------------------------------------------------- History:   PMH:  Chronic Kidney Disease.  Risk factors: Hypertension. Diabetes mellitus.  ------------------------------------------------------------------- Study Conclusions  - Left ventricle: The cavity size was normal. There was moderate   concentric hypertrophy. Systolic function was normal. The   estimated ejection fraction was in the range of 60% to 65%. Wall   motion was normal; there were no regional wall motion   abnormalities. Doppler parameters are consistent with abnormal   left ventricular relaxation (grade 1 diastolic  dysfunction). The   E/e&' ratio is between 8-15, suggesting indeterminate LV filling   pressure. - Aortic valve: Mildly calcified leaflets. Mild stenosis. Mean   gradient (S): 12 mm Hg. Peak gradient (S): 23 mm Hg. Valve area   (Vmax): 1.95 cm^2. Valve area (Vmean): 1.99 cm^2. - Mitral valve: Calcified annulus. Mildly  thickened leaflets .   There was trivial regurgitation. - Left atrium: The atrium was normal in size. - Inferior vena cava: The vessel was normal in size. The   respirophasic diameter changes were in the normal range (>= 50%),   consistent with normal central venous pressure.  Impressions:  - LVEF 60-65%, moderate LVH, normal wall motion, grade 1 DD,   indeterminate LV filling pressure, mild aortic stenosis - AVA   around 2.0 cm2, MAC with trivial MR, normal LA size, normal IVC.       Filed Weights   08/03/16 1451 08/03/16 2154  Weight: 108 kg (238 lb) 107 kg (235 lb 12.8 oz)     Microbiology: Recent Results (from the past 240 hour(s))  Urine Culture     Status: Abnormal   Collection Time: 08/03/16  5:39 PM  Result Value Ref Range Status   Specimen Description URINE, CLEAN CATCH  Final   Special Requests NONE  Final   Culture (A)  Final    <10,000 COLONIES/mL INSIGNIFICANT GROWTH Performed at [facility], [address], [place], [place] [address]    Report Status 08/05/2016 FINAL  Final  Blood culture (routine x 2)     Status: None (Preliminary result)   Collection Time: 08/03/16  5:50 PM  Result Value Ref Range Status   Specimen Description LEFT ANTECUBITAL  Final   Special Requests   Final    BOTTLES DRAWN AEROBIC AND ANAEROBIC Blood Culture adequate volume   Culture NO GROWTH 4 DAYS  Final   Report Status PENDING  Incomplete  Blood culture (routine x 2)     Status: None (Preliminary result)   Collection Time: 08/03/16  5:50 PM  Result Value Ref Range Status   Specimen Description BLOOD LEFT HAND  Final   Special Requests   Final     BOTTLES DRAWN AEROBIC AND ANAEROBIC Blood Culture adequate volume   Culture NO GROWTH 4 DAYS  Final   Report Status PENDING  Incomplete       Blood Culture    Component Value Date/Time   SDES LEFT ANTECUBITAL 08/03/2016 1750   SDES BLOOD LEFT HAND 08/03/2016 1750   SPECREQUEST  08/03/2016 1750    BOTTLES DRAWN AEROBIC AND ANAEROBIC Blood Culture adequate volume   SPECREQUEST  08/03/2016 1750    BOTTLES DRAWN AEROBIC AND ANAEROBIC Blood Culture adequate volume   CULT NO GROWTH 4 DAYS 08/03/2016 1750   CULT NO GROWTH 4 DAYS 08/03/2016 1750   REPTSTATUS PENDING 08/03/2016 1750   REPTSTATUS PENDING 08/03/2016 1750      Labs: Results for orders placed or performed during the hospital encounter of 08/03/16 (from the past 48 hour(s))  Glucose, capillary     Status: Abnormal   Collection Time: 08/05/16 11:42 AM  Result Value Ref Range   Glucose-Capillary 160 (H) 65 - 99 mg/dL   Comment 1 Notify RN    Comment 2 Document in Chart   Glucose, capillary     Status: Abnormal   Collection Time: 08/05/16  4:36 PM  Result Value Ref Range   Glucose-Capillary 109 (H) 65 - 99 mg/dL   Comment 1 Notify RN    Comment 2 Document in Chart   Glucose, capillary     Status: Abnormal   Collection Time: 08/05/16  9:18 PM  Result Value Ref Range   Glucose-Capillary 112 (H) 65 - 99 mg/dL   Comment 1 Notify RN    Comment 2 Document in Chart  Glucose, capillary     Status: Abnormal   Collection Time: 08/06/16  6:13 AM  Result Value Ref Range   Glucose-Capillary 111 (H) 65 - 99 mg/dL   Comment 1 Notify RN    Comment 2 Document in Chart   Glucose, capillary     Status: Abnormal   Collection Time: 08/06/16 11:25 AM  Result Value Ref Range   Glucose-Capillary 120 (H) 65 - 99 mg/dL  Glucose, capillary     Status: None   Collection Time: 08/06/16  4:08 PM  Result Value Ref Range   Glucose-Capillary 99 65 - 99 mg/dL  Glucose, capillary     Status: Abnormal   Collection Time: 08/06/16  9:37 PM   Result Value Ref Range   Glucose-Capillary 142 (H) 65 - 99 mg/dL   Comment 1 Notify RN    Comment 2 Document in Chart   Glucose, capillary     Status: Abnormal   Collection Time: 08/07/16  6:16 AM  Result Value Ref Range   Glucose-Capillary 108 (H) 65 - 99 mg/dL     Lipid Panel     Component Value Date/Time   CHOL 123 08/04/2016 0338   TRIG 122 08/04/2016 0338   HDL 29 (L) 08/04/2016 0338   CHOLHDL 4.2 08/04/2016 0338   VLDL 24 08/04/2016 0338   LDLCALC 70 08/04/2016 0338     Lab Results  Component Value Date   HGBA1C 6.5 (H) 08/03/2016     Lab Results  Component Value Date   LDLCALC 70 08/04/2016   CREATININE 1.49 (H) 08/05/2016     HPI :  66 y.o. right handed male with history of CKD stage III, hypertension, diabetes mellitus and tobacco abuse. History taken from chart review and patient. Patient lives with aunt and uncle. Independent prior to admission working full time. Two-level home with bedroom downstairs and multiple entry steps. He also has a girlfriend and daughter in the area. Presented 08/03/2016 with dizziness/diplopia and unsteady gait 2 weeks waxing and waning as well as previous fall. MRI brain reviewed, showing left cerebellar CVA. Per report, acute left brachium pontis infarcts. 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 Moderate to severe chronic small vessel ischemic disease. MRA with no emergent large vessel occlusion. Patient did not receive TPA. Echocardiogram with ejection fraction of 77% grade 1 diastolic dysfunction. CT angiogram of head and neck showed mild atherosclerotic disease of the carotid bifurcation but without stenosis. 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 Currently maintained on aspirin for CVA prophylaxis   HOSPITAL COURSE: *  CVA (cerebral vascular accident)  - MRI >>  Left pontine ischemic infarct- affecting vision and gait - MRA>> Old basal ganglia,  thalamus, pontine and LEFT cerebellar Infarcts, Moderate to severe chronic small vessel ischemic disease, suspicious for sellar mass- recommending MRI sella protocol with contrast is recommended on a nonemergent basis. 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 Outpatient Neurosurgery  referral has been provided    Cough/ yellow sputum in  a cigarette smoker - started about 3 days ago- likely has acute viral bronchitis- no wheezing at this time and so no steroids needed - Continue Nebs QID- hold off on antibiotic for now and follow - need outpt eval for COPD with PFTs - needs to quit smoking    Hypokalemia - K - 2.6>> 3.4>3.7 - Mg normal at 2.2    Mild elevated troponin - 0.11>>  0.11 and then < 0.03 - EKG reviewed- unrevealing - likely due to CKD- no recent chest pain- DOE may be due to above respiratory infection - ECHO shows normal wall motion    CKD (chronic kidney disease), stage III - stable    Diabetes mellitus type 2 in obese (HCC)    Accu-Chek stable - Metformin held due to renal insufficiency Instead started on low dose glipizide     Essential hypertension -Continue Bystolic  - hold losartan/ HCTZ pending improvement in renal function, resume in one to 2 weeks   BPH - Cardura resumed on admission   [place]ruia - in setting of BPH - asymptomatic - hold off on treating, follow urine culture   Obesity, Body mass index is 35.85 kg/m., recommend weight loss, diet and exercise as appropriate     Discharge  Exam:  Blood pressure (!) 153/98, pulse 75, temperature 98.2 F (36.8 C), temperature source Oral, resp. rate 16, height _0  (1.727 m), weight 107 kg (235 lb 12.8 oz), SpO2 99 %.  General exam: Appears comfortable  HEENT: PERRLA, oral mucosa moist, no sclera icterus or thrush Respiratory system: Clear to auscultation. Respiratory effort normal. Cardiovascular system: S1 & S2 heard, RRR.  No murmurs  Gastrointestinal system: Abdomen soft, non-tender, nondistended. Normal bowel sound. No organomegaly Central nervous system: Alert and oriented. No focal neurological deficits.    Follow-up Information    Dennie Bible, NP. Schedule an appointment as soon as possible for a visit in 6 week(s).   Specialty:  Family Medicine Why:  stroke follow up Contact information: [address] [place] [address] [phone]           Signed: Reyne Dumas 08/07/2016, 9:45 AM        Time spent >1 hour

## 2016-08-07 NOTE — Progress Notes (Signed)
RN also discussed discharge instructions with patient's friend, Earlie Server. She states she will help pt make appts w n/s and pcp. States she will help with medication administration. States patient will be staying at his aunt and uncle's house, not hers.

## 2016-08-07 NOTE — Progress Notes (Signed)
RN discussed discharge instructions with patient. Patient verbalized understanding of 3 f/u appts (pcp, n/s, neurology). Understands changes to medications and new prescriptions. IV removed, neuro assessment unchanged. Equipment was delivered to room. Will continue to monitor until patient's girlfriend arrives.

## 2016-08-07 NOTE — Progress Notes (Signed)
Physical Therapy Treatment Patient Details Name: Blake Moran MRN: [id number] DOB: 1950-05-30 Today's Date: 08/07/2016    History of Present Illness 66 y.o. 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     PT Comments    Pt making good progress towards achieving current functional goals. However, he continues to demonstrate deficits in cognition, vision, motor planning, coordination and balance. Pt ambulated in hallway without use of an AD with close min guard secondary to modest instability and drifting R/L. Pt performed poorly with attempted multi-tasking activity during walking, having to completely stop to read room numbers. Pt continues to be an excellent candidate for CIR for further intensive rehab services prior to returning home.     Follow Up Recommendations  CIR     Equipment Recommendations  None recommended by PT    Recommendations for Other Services       Precautions / Restrictions Precautions Precautions: Fall Restrictions Weight Bearing Restrictions: No    Mobility  Bed Mobility Overal bed mobility: Modified Independent             General bed mobility comments: pt sitting OOB in recliner chair upon arrival  Transfers Overall transfer level: Needs assistance Equipment used: None Transfers: Sit to/from Stand Sit to Stand: Min guard         General transfer comment: min guard for safety, verbal reminders for safety and to not stand up without someone present  Ambulation/Gait Ambulation/Gait assistance: Min guard Ambulation Distance (Feet): 300 Feet Assistive device: None Gait Pattern/deviations: Step-through pattern;Drifts right/left;Narrow base of support Gait velocity: decreased Gait velocity interpretation: Below normal speed for  age/gender General Gait Details: pt with modest instability but no overt LOB or need for physical assistance, close min guard for safety. pt drifting laterally throughout and difficulty with multi-tasking during ambulation. Unable to continue ambulating while reading room numbers   Stairs Stairs: Yes   Stair Management: Two rails;Alternating pattern;Forwards      Wheelchair Mobility    Modified Rankin (Stroke Patients Only) Modified Rankin (Stroke Patients Only) Pre-Morbid Rankin Score: No symptoms Modified Rankin: Moderate disability     Balance Overall balance assessment: Needs assistance Sitting-balance support: No upper extremity supported;Feet supported Sitting balance-Leahy Scale: Good     Standing balance support: During functional activity;No upper extremity supported Standing balance-Leahy Scale: Fair Standing balance comment: fair with static standing, poor with dynamic tasks                            Cognition Arousal/Alertness: Awake/alert Behavior During Therapy: WFL for tasks assessed/performed Overall Cognitive Status: Impaired/Different from baseline Area of Impairment: Problem solving;Safety/judgement                         Safety/Judgement: Decreased awareness of safety   Problem Solving: Slow processing General Comments: Pt slow to answer, but when he does answer, it is accurate.        Exercises Other Exercises Other Exercises: bilateral LE coordination and dissociation activities in sitting with demonstration and cueing    General Comments        Pertinent Vitals/Pain Pain Assessment: No/denies pain    Home Living  Prior Function            PT Goals (current goals can now be found in the care plan section) Acute Rehab PT Goals Patient Stated Goal: to go to inpatient rehab to get better PT Goal Formulation: With patient/family Time For Goal Achievement: 08/19/16 Potential to  Achieve Goals: Good Progress towards PT goals: Progressing toward goals    Frequency    Min 4X/week      PT Plan Current plan remains appropriate    Co-evaluation              AM-PAC PT "6 Clicks" Daily Activity  Outcome Measure  Difficulty turning over in bed (including adjusting bedclothes, sheets and blankets)?: None Difficulty moving from lying on back to sitting on the side of the bed? : None Difficulty sitting down on and standing up from a chair with arms (e.g., wheelchair, bedside commode, etc,.)?: Total Help needed moving to and from a bed to chair (including a wheelchair)?: A Little Help needed walking in hospital room?: A Little Help needed climbing 3-5 steps with a railing? : A Little 6 Click Score: 18    End of Session Equipment Utilized During Treatment: Gait belt Activity Tolerance: Patient tolerated treatment well Patient left: in chair;with call bell/phone within reach;with chair alarm set Nurse Communication: Mobility status PT Visit Diagnosis: Unsteadiness on feet (R26.81);Other symptoms and signs involving the nervous system (R29.898)     Time: 9758-8325 PT Time Calculation (min) (ACUTE ONLY): 17 min  Charges:  $Gait Training: 8-22 mins                    G Codes:       [place], [place], [place] 08/07/2016, 1:50 PM

## 2016-08-07 NOTE — Care Management Note (Signed)
Case Management Note  Patient Details  Name: Blake Moran MRN: [id number] Date of Birth: November 07, 1950  Subjective/Objective:                    Action/Plan: Bedside RN feels patient may need medication f/u at home. CM notified MD and added [facility] RN. [facility] notified and orders faxed.   Expected Discharge Date:  08/07/16               Expected Discharge Plan:  [place]  In-House Referral:     Discharge planning Services  CM Consult  Post Acute Care Choice:  Durable Medical Equipment, Home Health Choice offered to:  Patient  DME Arranged:  Walker rolling DME Agency:  [place] Arranged:  PT, OT, Nurse's Aide, RN [facility] Agency:  [facility]  Status of Service:  Completed, signed off  If discussed at [place] of Stay Meetings, dates discussed:    Additional Comments:  Pollie Friar, RN 08/07/2016, 4:51 PM

## 2016-08-07 NOTE — Progress Notes (Signed)
Occupational Therapy Evaluation Patient Details Name: Blake Moran MRN: [id number] DOB: 01/25/1951 Today's Date: 08/07/2016    History of Present Illness 66 y.o. 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    Clinical Impression   Pt making excellent progress. States double vision is "much better". Pt requires min A with mobility and ADL due to apparent vision deficits, uncoordinated movements and risk for falls. Continue to recommend CIR for rehab to maximize pt's functional level of independence with ADL and IADL tasks and mobility and facilitate eventual return to work..     Follow Up Recommendations  CIR;Supervision/Assistance - 24 hour    Equipment Recommendations  3 in 1 bedside commode    Recommendations for Other Services Rehab consult     Precautions / Restrictions Precautions Precautions: Fall      Mobility Bed Mobility Overal bed mobility: Modified Independent                Transfers                      Balance Overall balance assessment: Needs assistance   Sitting balance-Leahy Scale: Good       Standing balance-Leahy Scale: Fair                             ADL either performed or assessed with clinical judgement   ADL       Grooming: Standing;Min guard   Upper Body Bathing: Supervision/ safety;Standing   Lower Body Bathing: Min guard;Sit to/from stand   Upper Body Dressing : Supervision/safety;Sitting   Lower Body Dressing: Min guard;Sit to/from stand   Toilet Transfer: Minimal assistance Armed forces technical officer Details (indicate cue type and reason): without use of AD Toileting- Clothing Manipulation and Hygiene: Supervision/safety       Functional mobility during ADLs: Minimal assistance General ADL Comments:  Pt with LOB during more dynamic tasks. Uses counter to stabilize self during activities     Vision   Additional Comments: impaired. Delayed closure L eye. Pt with poor visual attentio, expecially looking L. Nystagmus noted in R field. Although improved, Pt continues to have difficulty wth reading (has glasses now. States double vision is much better.)     Perception     Praxis      Pertinent Vitals/Pain Pain Assessment: No/denies pain     Hand Dominance     Extremity/Trunk Assessment             Communication     Cognition Arousal/Alertness: Awake/alert Behavior During Therapy: WFL for tasks assessed/performed Overall Cognitive Status: Impaired/Different from baseline Area of Impairment: Problem solving                             Problem Solving: Slow processing General Comments: Pt slow to answer, but when he does answer, it is accurate.     General Comments       Exercises     Shoulder Instructions      Home Living  Prior Functioning/Environment                   OT Problem List:        OT Treatment/Interventions:      OT Goals(Current goals can be found in the care plan section) Acute Rehab OT Goals Patient Stated Goal: to go to inpatient rehab to get better OT Goal Formulation: With patient Time For Goal Achievement: 08/18/16 Potential to Achieve Goals: Good ADL Goals Pt Will Perform Lower Body Bathing: with modified independence;sit to/from stand Pt Will Perform Lower Body Dressing: with modified independence;sit to/from stand Pt Will Transfer to Toilet: with modified independence;bedside commode;ambulating Pt Will Perform Toileting - Clothing Manipulation and hygiene: with modified independence;sit to/from stand Pt Will Perform Tub/Shower Transfer: rolling walker;3 in 1;ambulating;Shower transfer;with supervision Additional ADL Goal #1: Pt/caregiver will independently  verbalize 3 strategies to compensate for visual deficits and ince=rease independence adn safety with ADL and mobility  OT Frequency: Min 2X/week   Barriers to D/C:            Co-evaluation              AM-PAC PT "6 Clicks" Daily Activity     Outcome Measure Help from another person eating meals?: None Help from another person taking care of personal grooming?: A Little Help from another person toileting, which includes using toliet, bedpan, or urinal?: A Little Help from another person bathing (including washing, rinsing, drying)?: A Little Help from another person to put on and taking off regular upper body clothing?: A Little Help from another person to put on and taking off regular lower body clothing?: A Little 6 Click Score: 19   End of Session Equipment Utilized During Treatment: Gait belt Nurse Communication: Mobility status  Activity Tolerance: Patient tolerated treatment well Patient left: in chair;with call bell/phone within reach;with chair alarm set  OT Visit Diagnosis: Unsteadiness on feet (R26.81);Muscle weakness (generalized) (M62.81);History of falling (Z91.81);Low vision, both eyes (H54.2)                Time: 3785-8850 OT Time Calculation (min): 19 min Charges:  OT General Charges $OT Visit: 1 Procedure OT Treatments $Self Care/Home Management : 8-22 mins G-Codes:    [place], OT/L  [phone] 08/07/2016 08/07/2016, 1:32 PM

## 2016-08-07 NOTE — Care Management Note (Addendum)
Case Management Note  Patient Details  Name: Blake Moran MRN: [id number] Date of Birth: [birth year]  Subjective/Objective:                    Action/Plan: Pt received a denial from [facility] for CIR. Per CSW he also will not be able to do SNF rehab under his BCBS d/t ability to walk 350 feet.  CM informed the patient and he is agreeable to [facility] services. CM provided him a list of [place] agencies. He didn't have a preference and the only agency near [place] that takes [place]lla [place] is [facility]. Patient is in agreement with using them. CM called and faxed information to [facility].  Pt with orders for walker. Jermaine with [facility] DME notified and delivered walker to his room. OT recommending 3 in 1. Pt refused.  Patients girlfriend to transport him home.   Expected Discharge Date:  08/07/16               Expected Discharge Plan:  [place]  In-House Referral:     Discharge planning Services  CM Consult  Post Acute Care Choice:  Durable Medical Equipment, Home Health Choice offered to:  Patient  DME Arranged:  Walker rolling DME Agency:  [place] Arranged:  PT, OT, Nurse's Aide [place] Agency:  [facility]  Status of Service:  Completed, signed off  If discussed at [place] of Stay Meetings, dates discussed:    Additional Comments:  Pollie Friar, RN 08/07/2016, 4:15 PM

## 2016-08-07 NOTE — Progress Notes (Signed)
Rehab admissions - I have faxed updates to insurance case manager.  Hope to have a decision soon today.  #[phone]

## 2016-08-07 NOTE — Progress Notes (Signed)
Rehab admissions - I have received a denial from [facility] case manager for acute inpatient rehab admission.  Options will likely be home with [facility] or outpatient therapies.  Call me for questions.  #[phone]

## 2016-08-07 NOTE — Progress Notes (Signed)
Rehab admissions - I continue to await callback from insurance carrier.  [employer] [place] tells me that it may take up to 48 hours for them to respond to my request.  I do have beds on rehab if we hear back from [facility] and if we get approval.  Call me for questions.  #[phone]

## 2016-08-07 NOTE — Progress Notes (Signed)
Rehab admissions - Insurance case manager called me and wants updated PT/OT notes from today in order to make a decision regarding possible inpatient rehab admission for today.  I have called to ask PT/OT if we can get updates soon.  Call me for questions.  #[phone]

## 2016-08-08 LAB — CULTURE, BLOOD (ROUTINE X 2)
Culture: NO GROWTH
Culture: NO GROWTH
SPECIAL REQUESTS: ADEQUATE
Special Requests: ADEQUATE

## 2016-08-12 ENCOUNTER — Encounter (HOSPITAL_COMMUNITY): Payer: Self-pay | Admitting: Emergency Medicine

## 2016-08-12 ENCOUNTER — Emergency Department (HOSPITAL_COMMUNITY): Payer: BLUE CROSS/BLUE SHIELD

## 2016-08-12 ENCOUNTER — Emergency Department (HOSPITAL_COMMUNITY)
Admission: EM | Admit: 2016-08-12 | Discharge: 2016-08-12 | Disposition: A | Discharge status: Home / Self Care | Payer: BLUE CROSS/BLUE SHIELD | Attending: Emergency Medicine | Admitting: Emergency Medicine

## 2016-08-12 DIAGNOSIS — E1122 Type 2 diabetes mellitus with diabetic chronic kidney disease: Secondary | ICD-10-CM | POA: Diagnosis not present

## 2016-08-12 DIAGNOSIS — F17200 Nicotine dependence, unspecified, uncomplicated: Secondary | ICD-10-CM | POA: Diagnosis not present

## 2016-08-12 DIAGNOSIS — I129 Hypertensive chronic kidney disease with stage 1 through stage 4 chronic kidney disease, or unspecified chronic kidney disease: Secondary | ICD-10-CM | POA: Diagnosis not present

## 2016-08-12 DIAGNOSIS — N183 Chronic kidney disease, stage 3 (moderate): Secondary | ICD-10-CM | POA: Diagnosis not present

## 2016-08-12 DIAGNOSIS — R29810 Facial weakness: Secondary | ICD-10-CM | POA: Diagnosis not present

## 2016-08-12 DIAGNOSIS — Z8673 Personal history of transient ischemic attack (TIA), and cerebral infarction without residual deficits: Secondary | ICD-10-CM | POA: Diagnosis not present

## 2016-08-12 DIAGNOSIS — R410 Disorientation, unspecified: Secondary | ICD-10-CM

## 2016-08-12 DIAGNOSIS — I69392 Facial weakness following cerebral infarction: Secondary | ICD-10-CM

## 2016-08-12 DIAGNOSIS — I639 Cerebral infarction, unspecified: Secondary | ICD-10-CM

## 2016-08-12 LAB — CBC
HEMATOCRIT: 38.4 % — AB (ref 39.0–52.0)
HEMOGLOBIN: 12.1 g/dL — AB (ref 13.0–17.0)
MCH: 24.9 pg — AB (ref 26.0–34.0)
MCHC: 31.5 g/dL (ref 30.0–36.0)
MCV: 79 fL (ref 78.0–100.0)
Platelets: 357 10*3/uL (ref 150–400)
RBC: 4.86 MIL/uL (ref 4.22–5.81)
RDW: 14.5 % (ref 11.5–15.5)
WBC: 8.8 10*3/uL (ref 4.0–10.5)

## 2016-08-12 LAB — COMPREHENSIVE METABOLIC PANEL
ALBUMIN: 3.3 g/dL — AB (ref 3.5–5.0)
ALK PHOS: 72 U/L (ref 38–126)
ALT: 16 U/L — ABNORMAL LOW (ref 17–63)
AST: 20 U/L (ref 15–41)
Anion gap: 9 (ref 5–15)
BILIRUBIN TOTAL: 0.4 mg/dL (ref 0.3–1.2)
BUN: 20 mg/dL (ref 6–20)
CALCIUM: 9.1 mg/dL (ref 8.9–10.3)
CO2: 21 mmol/L — ABNORMAL LOW (ref 22–32)
Chloride: 109 mmol/L (ref 101–111)
Creatinine, Ser: 1.73 mg/dL — ABNORMAL HIGH (ref 0.61–1.24)
GFR calc Af Amer: 46 mL/min — ABNORMAL LOW (ref >60)
GFR calc non Af Amer: 40 mL/min — ABNORMAL LOW (ref >60)
GLUCOSE: 173 mg/dL — AB (ref 65–99)
Potassium: 3.5 mmol/L (ref 3.5–5.1)
Sodium: 139 mmol/L (ref 135–145)
TOTAL PROTEIN: 7.5 g/dL (ref 6.5–8.1)

## 2016-08-12 LAB — I-STAT TROPONIN, ED: Troponin i, poc: 0 ng/mL (ref 0.00–0.08)

## 2016-08-12 LAB — URINALYSIS, ROUTINE W REFLEX MICROSCOPIC
BACTERIA UA: NONE SEEN
Bilirubin Urine: NEGATIVE
Glucose, UA: NEGATIVE mg/dL
Hgb urine dipstick: NEGATIVE
Ketones, ur: NEGATIVE mg/dL
Leukocytes, UA: NEGATIVE
Nitrite: NEGATIVE
PROTEIN: 100 mg/dL — AB
SQUAMOUS EPITHELIAL / LPF: NONE SEEN
Specific Gravity, Urine: 1.019 (ref 1.005–1.030)
pH: 5 (ref 5.0–8.0)

## 2016-08-12 LAB — I-STAT CHEM 8, ED
BUN: 24 mg/dL — AB (ref 6–20)
CALCIUM ION: 1.15 mmol/L (ref 1.15–1.40)
CREATININE: 1.7 mg/dL — AB (ref 0.61–1.24)
Chloride: 109 mmol/L (ref 101–111)
GLUCOSE: 172 mg/dL — AB (ref 65–99)
HEMATOCRIT: 37 % — AB (ref 39.0–52.0)
Hemoglobin: 12.6 g/dL — ABNORMAL LOW (ref 13.0–17.0)
Potassium: 3.6 mmol/L (ref 3.5–5.1)
Sodium: 143 mmol/L (ref 135–145)
TCO2: 24 mmol/L (ref 0–100)

## 2016-08-12 LAB — DIFFERENTIAL
BASOS ABS: 0 10*3/uL (ref 0.0–0.1)
Basophils Relative: 0 %
EOS PCT: 2 %
Eosinophils Absolute: 0.1 10*3/uL (ref 0.0–0.7)
LYMPHS ABS: 2.1 10*3/uL (ref 0.7–4.0)
LYMPHS PCT: 24 %
Monocytes Absolute: 0.2 10*3/uL (ref 0.1–1.0)
Monocytes Relative: 2 %
NEUTROS PCT: 72 %
Neutro Abs: 6.3 10*3/uL (ref 1.7–7.7)

## 2016-08-12 LAB — APTT: aPTT: 35 seconds (ref 24–36)

## 2016-08-12 LAB — PROTIME-INR
INR: 1.07
Prothrombin Time: 13.9 seconds (ref 11.4–15.2)

## 2016-08-12 LAB — ETHANOL: Alcohol, Ethyl (B): <5 mg/dL (ref <5)

## 2016-08-12 NOTE — ED Notes (Signed)
Previously charted that pt was transported to MRI however charted in error. Pt transported to MRI at this time.

## 2016-08-12 NOTE — ED Notes (Signed)
Facial droop on the left.

## 2016-08-12 NOTE — ED Triage Notes (Signed)
Son stated, He got out of hospital on Friday for a mini strok, and he was ok , last normal was Sunday, but since then he has had some drooping down on left side. I also noted his speech is becoming slurred. Last normal was Sunday.

## 2016-08-12 NOTE — Consult Note (Cosign Needed)
Requesting Physician: Dr. Stark Jock    Chief Complaint: Stroke  History obtained from:  Patient and Chart   HPI:                                                                                                                                      Blake Moran is an 66 y.o. male with a PMH of TIA, CVA, DM2, HTN and CKD presents with slurred speech, facial numbness and weakness. The son stated that he began noticing a right facial droop on Sunday but did not fully appreciate it until Tuesday. Slurred speech was reported beginning on Tuesday, but actual time of onset is unclear. He reported a 'gray film' over the vision in his left eye on Monday and Tuesday that has since resolved.  Blake Moran was recently discharged on Friday following CVA workup. Residual symptoms following this event include diplopia specifically out of the left eye. He denies dizziness and headache.  Pertinent Imaging MR brain 7/2: Acute LEFT brachium pontis infarcts. 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 Moderate to severe chronic small vessel ischemic disease. MRA 7/2: No LVO CTA head/neck 7/3: Atherosclerotic disease without severe stenosis MR brain 7/11 pending  Date last known well: Date: 08/09/2016 Time last known well: Unable to determine  tPA Given: No: Out of window.  Modified Rankin Score: 1  Stroke Risk Factors - carotid stenosis, diabetes mellitus, hyperlipidemia, hypertension, smoking and personal history of CVA   Past Medical History:  Diagnosis Date  . BPH (benign prostatic hyperplasia)   . CKD (chronic kidney disease), stage III   . Diabetes mellitus without complication ([place])   . Hypertension   . Vitamin D deficiency     Past Surgical History:  Procedure Laterality Date  . NO PAST SURGERIES      Family History  Problem Relation Age of Onset  . Cancer Mother 71  . CAD Father 69  . Stroke Neg Hx      reports that he has been smoking.  He has a 22.00 pack-year  smoking history. He has never used smokeless tobacco. He reports that he does not drink alcohol or use drugs.  Allergies  Allergen Reactions  . Lisinopril Swelling    Medications:  No outpatient prescriptions have been marked as taking for the 08/12/16 encounter [facility] Encounter).    Review Of Systems:                                                                                                           History obtained from the patient  General: Negative for chills, fatigue Psychological: Negative for any known behavioral disorder Ophthalmic: As above. Negative for blurry vision, loss of vision in any field ENT: As above. Negative for abrupt loss of hearing, tinnitus Respiratory: Negative for cough, shortness of breath Cardiovascular: Negative for chest pain, edema or irregular heartbeat Gastrointestinal: Negative for abdominal pain, nausea/vomiting Genito-Urinary: Negative for dysuria Musculoskeletal: Negative for joint swelling or muscular weakness Neurological: As noted in HPI Dermatological: As above. Negative for tingling  Blood pressure (!) 144/89, pulse 67, temperature 98.7 F (37.1 C), resp. rate 14, height 5\' 8"  (1.727 m), weight 106.6 kg (235 lb), SpO2 100 %.   Physical Examination:                                                                                                      General: WDWN male.  HEENT:  Normocephalic, no lesions, without obvious abnormality.  Normal external eye and conjunctiva.  Normal external ears. Normal external nose, mucus membranes and septum.  Normal pharynx. Cardiovascular: S1, S2 normal, pulses palpable throughout   Pulmonary: chest clear, no wheezing, rales, normal symmetric air entry Abdomen: soft, non-tender Extremities: no joint deformities, effusion, or inflammation Musculoskeletal: no joint tenderness,  deformity or swelling. Tone and bulk normal throughout; no atrophy noted Skin: warm and dry, no hyperpigmentation, vitiligo, or suspicious lesions  Neurological Examination:                                                                                               Mental Status: Blake Moran is Alert, oriented, thought content appropriate.  Speech dysarthric without evidence of aphasia. Able to follow 3-step commands without difficulty. Cranial Nerves: II: Visual fields grossly normal, pupils are equal, round, reactive to light. III,IV, VI: Ptosis not present, extra-ocular muscle movements intact bilaterally. Disconjugate gaze with slight exotropia of the left eye. No nystagmus noted. Hypometric saccades bilaterally. V,VII: Smile and eyebrow raise is flat on  the left. Facial light touch and pinprick sensation decreased on the left. VIII: Hearing grossly intact IX,X: Uvula and palate rise symmetrically XI: SCM and bilateral shoulder shrug strength 5/5 XII: Midline tongue extension Motor: Right :     Upper extremity   5/5   Left:     Upper extremity   4+/5          Lower extremity   5/5    Lower extremity   4+/5 Pronator drift present on the left Sensory: Temperature, pinprick and light touch sensation decreased on the left Deep Tendon Reflexes: 2+ and symmetric throughout Plantars: Right: downgoing   Left: downgoing Cerebellar: Finger-to-nose and heel-to-shin tests ataxic on the left. Proprioception: Vibratory sense intact, delayed on the left Gait: Not tested  Lab Results: Basic Metabolic Panel:  Recent Labs Lab 08/12/16 1041 08/12/16 1055  NA 139 143  K 3.5 3.6  CL 109 109  CO2 21*  --   GLUCOSE 173* 172*  BUN 20 24*  CREATININE 1.73* 1.70*  CALCIUM 9.1  --     Liver Function Tests:  Recent Labs Lab 08/12/16 1041  AST 20  ALT 16*  ALKPHOS 72  BILITOT 0.4  PROT 7.5  ALBUMIN 3.3*   No results for input(s): LIPASE, AMYLASE in the last 168 hours. No results for  input(s): AMMONIA in the last 168 hours.  CBC:  Recent Labs Lab 08/12/16 1041 08/12/16 1055  WBC 8.8  --   NEUTROABS 6.3  --   HGB 12.1* 12.6*  HCT 38.4* 37.0*  MCV 79.0  --   PLT 357  --     Cardiac Enzymes: No results for input(s): CKTOTAL, CKMB, CKMBINDEX, TROPONINI in the last 168 hours.  Lipid Panel: No results for input(s): CHOL, TRIG, HDL, CHOLHDL, VLDL, LDLCALC in the last 168 hours.  CBG:  Recent Labs Lab 08/06/16 1608 08/06/16 2137 08/07/16 0616 08/07/16 1135 08/07/16 1624  GLUCAP 99 142* 108* 97 103*    Microbiology: Results for orders placed or performed during the hospital encounter of 08/03/16  Urine Culture     Status: Abnormal   Collection Time: 08/03/16  5:39 PM  Result Value Ref Range Status   Specimen Description URINE, CLEAN CATCH  Final   Special Requests NONE  Final   Culture (A)  Final    <10,000 COLONIES/mL INSIGNIFICANT GROWTH Performed at [facility], [place] [address] [place], [place] [address]    Report Status 08/05/2016 FINAL  Final  Blood culture (routine x 2)     Status: None   Collection Time: 08/03/16  5:50 PM  Result Value Ref Range Status   Specimen Description LEFT ANTECUBITAL  Final   Special Requests   Final    BOTTLES DRAWN AEROBIC AND ANAEROBIC Blood Culture adequate volume   Culture NO GROWTH 5 DAYS  Final   Report Status 08/08/2016 FINAL  Final  Blood culture (routine x 2)     Status: None   Collection Time: 08/03/16  5:50 PM  Result Value Ref Range Status   Specimen Description BLOOD LEFT HAND  Final   Special Requests   Final    BOTTLES DRAWN AEROBIC AND ANAEROBIC Blood Culture adequate volume   Culture NO GROWTH 5 DAYS  Final   Report Status 08/08/2016 FINAL  Final    Coagulation Studies:  Recent Labs  08/12/16 1041  LABPROT 13.9  INR 1.07    Imaging: No results found.   Assessment and Plan:  66 year old  male with subacute stroke. Patient had recent stroke workup at this facility.  As such,  we are ordering an additional MRI to assess any new or evolving lesion as well as his pituitary mass, and recommend he follow with his outpatient neurologist.  Lupita Raider PA-C [facility]   08/12/2016, 12:42 PM

## 2016-08-12 NOTE — ED Notes (Signed)
Walked patient to the bathroom  

## 2016-08-12 NOTE — ED Provider Notes (Signed)
[facility] DEPT Provider Note   CSN: [id number] Arrival date & time: 08/12/16  1014     History   Chief Complaint Chief Complaint  Patient presents with  . Aphasia    HPI Blake Moran is a 66 y.o. male who presents to the emergency department with left-sided facial droop 3 days ago that began with associated weakness in the left leg that began yesterday. He was admitted from 7/2-6 for a CVA. He was seen by his PCP Karma Ganja 2 days ago for the worsening weakness and facial droop. He also reports decreased hearing in the left ear and "cloudy" vision in the left eye since yesterday. The patient's son reports that he noticed "twisting" to the left side of the patient's mouth since yesterday. No recent falls or injuries. He reports that he has a follow-up appointment with neurology, but they are unable to see him until Sept.    The history is provided by the patient. No language interpreter was used.    Past Medical History:  Diagnosis Date  . BPH (benign prostatic hyperplasia)   . CKD (chronic kidney disease), stage III   . Diabetes mellitus without complication ([place])   . Hypertension   . Vitamin D deficiency     Patient Active Problem List   Diagnosis Date Noted  . 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 Vertigo   . Morbid obesity ([place])   . Diastolic dysfunction   . Benign essential HTN   . Acute blood loss anemia   . Leukocytosis   . Hyperlipidemia   . Dizziness   . Elevated serum creatinine   . Urinary tract infection without hematuria   . CVA (cerebral vascular accident) ([place]) 08/03/2016  . Hypokalemia 08/03/2016  . CKD (chronic kidney disease), stage III 08/03/2016  . Diabetes mellitus type 2 in obese ([place]) 08/03/2016  . 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 Essential hypertension 08/03/2016    Past Surgical History:  Procedure Laterality Date  . NO PAST SURGERIES         Home Medications    Prior to Admission  medications   Medication Sig Start Date End Date Taking? Authorizing Provider  aspirin EC 325 MG tablet Take 1 tablet (325 mg total) by mouth daily. 08/06/16   Reyne Dumas, MD  atorvastatin (LIPITOR) 20 MG tablet Take 1 tablet (20 mg total) by mouth daily at 6 PM. 08/06/16   Reyne Dumas, MD  doxazosin (CARDURA) 8 MG tablet Take 8 mg by mouth at bedtime.    [provider]  glipiZIDE (GLUCOTROL) 5 MG tablet Take 0.5 tablets (2.5 mg total) by mouth daily before breakfast. 08/07/16 08/07/17  Reyne Dumas, MD  losartan-hydrochlorothiazide (HYZAAR) 100-12.5 MG tablet Take 1 tablet by mouth daily. 08/20/16   Reyne Dumas, MD  Nebivolol HCl (BYSTOLIC) 20 MG TABS Take 20 mg by mouth daily.    [provider]  promethazine (PHENERGAN) 12.5 MG tablet Take 12.5 mg by mouth every 6 (six) hours as needed for nausea or vomiting.    [provider]  senna-docusate (SENOKOT-S) 8.6-50 MG tablet Take 1 tablet by mouth at bedtime as needed for mild constipation. 08/06/16   Reyne Dumas, MD  Vitamin D, Ergocalciferol, (DRISDOL) 50000 units CAPS capsule Take 50,000 Units by mouth every Tuesday.    [provider]    Family History Family History  Problem Relation Age of Onset  . Cancer Mother 73  . CAD Father 41  .  Stroke Neg Hx     Social History Social History  Substance Use Topics  . Smoking status: Current Every Day Smoker    Packs/day: 0.50    Years: 44.00  . Smokeless tobacco: Never Used  . Alcohol use No     Allergies   Lisinopril   Review of Systems Review of Systems  Constitutional: Negative for activity change.  Eyes: Positive for visual disturbance.  Respiratory: Negative for shortness of breath.   Cardiovascular: Negative for chest pain.  Gastrointestinal: Negative for abdominal pain.  Musculoskeletal: Negative for back pain.  Skin: Negative for rash.  Neurological: Positive for facial asymmetry and weakness.     Physical Exam Updated Vital  Signs BP 139/71   Pulse 64   Temp 98.7 F (37.1 C)   Resp 14   Ht 5\' 8"  (1.727 m)   Wt 106.6 kg (235 lb)   SpO2 100%   BMI 35.73 kg/m   Physical Exam  Constitutional: He appears well-developed.  HENT:  Head: Normocephalic.  Eyes: Pupils are equal, round, and reactive to light. Conjunctivae and EOM are normal.  Neck: Neck supple.  Cardiovascular: Normal rate and regular rhythm.   No murmur heard. Pulmonary/Chest: Effort normal.  Abdominal: Soft. He exhibits no distension.  Musculoskeletal:  Good strength against resistance of the bilateral upper and lower extremities.  Neurological: He is alert.  Asymmetric smile on the left. Unable to raise left eyebrow. Remainder of cranial nerves are grossly intact. Sensation is grossly intact. Negative Romberg. Normal finger-to-nose. Ambulatory without difficulty.   Skin: Skin is warm and dry.  Psychiatric: His behavior is normal.  Nursing note and vitals reviewed.    ED Treatments / Results  Labs (all labs ordered are listed, but only abnormal results are displayed) Labs Reviewed  CBC - Abnormal; Notable for the following:       Result Value   Hemoglobin 12.1 (*)    HCT 38.4 (*)    MCH 24.9 (*)    All other components within normal limits  COMPREHENSIVE METABOLIC PANEL - Abnormal; Notable for the following:    CO2 21 (*)    Glucose, Bld 173 (*)    Creatinine, Ser 1.73 (*)    Albumin 3.3 (*)    ALT 16 (*)    GFR calc non Af Amer 40 (*)    GFR calc Af Amer 46 (*)    All other components within normal limits  URINALYSIS, ROUTINE W REFLEX MICROSCOPIC - Abnormal; Notable for the following:    Protein, ur 100 (*)    All other components within normal limits  I-STAT CHEM 8, ED - Abnormal; Notable for the following:    BUN 24 (*)    Creatinine, Ser 1.70 (*)    Glucose, Bld 172 (*)    Hemoglobin 12.6 (*)    HCT 37.0 (*)    All other components within normal limits  ETHANOL  PROTIME-INR  APTT  DIFFERENTIAL  I-STAT TROPOININ,  ED    EKG  EKG Interpretation None       Radiology No results found.  Procedures Procedures (including critical care time)  Medications Ordered in ED Medications - No data to display   Initial Impression / Assessment and Plan / ED Course  I have reviewed the triage vital signs and the nursing notes.  Pertinent labs & imaging results that were available during my care of the patient were reviewed by me and considered in my medical decision making (see chart for details).  Patient with recent CVA on 7/2 presents with acute facial drooping and weakness. Discussed and evaluated the patient with Dr Stark Jock, attending physician. Consulted Dr. Rory Percy with neurology who recommended ordering an MRI to assess any new or evolving lesions including his pituitary mass. 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 Discussed the plan with the patient and the patient's son who are agreeable. The son reports he was able to get the patient's first OP neurology moved to August. NAD. VSS. The patient is safe for discharge at this time.   Final Clinical Impressions(s) / ED Diagnoses   Final diagnoses:  CVA, old, facial weakness    New Prescriptions Discharge Medication List as of 08/12/2016  5:59 PM       Joline Maxcy A, PA-C 08/14/16 1857    Veryl Speak, MD 08/14/16 2351

## 2016-08-12 NOTE — ED Notes (Signed)
Pt to MRI

## 2016-08-12 NOTE — Discharge Instructions (Signed)
Please keep your outpatient follow-up visit with neurology. If you develop new symptoms, including weakness on the right side new falls or injuries, or symptoms that severely worsen, please return to the emergency department for reevaluation.

## 2016-08-12 NOTE — Consult Note (Signed)
Requesting Physician: Dr. Stark Jock    Chief Complaint: Stroke  History obtained from:  Patient and Chart   HPI:                                                                                                                                      Blake Moran is an 66 y.o. male with a PMH of TIA, CVA, DM2, HTN and CKD presents with slurred speech, facial numbness and weakness. The son stated that he began noticing a right facial droop on Sunday but did not fully appreciate it until Tuesday. Slurred speech was reported beginning on Tuesday, but actual time of onset is unclear. He reported a 'gray film' over the vision in his left eye on Monday and Tuesday that has since resolved.  Mr. Nocito was recently discharged on Friday following CVA workup. Residual symptoms following this event include diplopia specifically out of the left eye. He denies dizziness and headache.  Pertinent Imaging MR brain 7/2: Acute LEFT brachium pontis infarcts. 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 Moderate to severe chronic small vessel ischemic disease. MRA 7/2: No LVO CTA head/neck 7/3: Atherosclerotic disease without severe stenosis MR brain 7/11 pending  Date last known well: Date: 08/09/2016 Time last known well: Unable to determine  tPA Given: No: Out of window.  Modified Rankin Score: 1  Stroke Risk Factors - carotid stenosis, diabetes mellitus, hyperlipidemia, hypertension, smoking and personal history of CVA   Past Medical History:  Diagnosis Date  . BPH (benign prostatic hyperplasia)   . CKD (chronic kidney disease), stage III   . Diabetes mellitus without complication ([place])   . Hypertension   . Vitamin D deficiency     Past Surgical History:  Procedure Laterality Date  . NO PAST SURGERIES      Family History  Problem Relation Age of Onset  . Cancer Mother 17  . CAD Father 65  . Stroke Neg Hx      reports that he has been smoking.  He has a 22.00 pack-year  smoking history. He has never used smokeless tobacco. He reports that he does not drink alcohol or use drugs.  Allergies  Allergen Reactions  . Lisinopril Swelling    Medications:  No outpatient prescriptions have been marked as taking for the 08/12/16 encounter [facility] Encounter).    Review Of Systems:                                                                                                           History obtained from the patient  General: Negative for chills, fatigue Psychological: Negative for any known behavioral disorder Ophthalmic: As above. Negative for blurry vision, loss of vision in any field ENT: As above. Negative for abrupt loss of hearing, tinnitus Respiratory: Negative for cough, shortness of breath Cardiovascular: Negative for chest pain, edema or irregular heartbeat Gastrointestinal: Negative for abdominal pain, nausea/vomiting Genito-Urinary: Negative for dysuria Musculoskeletal: Negative for joint swelling or muscular weakness Neurological: As noted in HPI Dermatological: As above. Negative for tingling  Blood pressure (!) 144/89, pulse 67, temperature 98.7 F (37.1 C), resp. rate 14, height 5\' 8"  (1.727 m), weight 106.6 kg (235 lb), SpO2 100 %.   Physical Examination:                                                                                                      General: WDWN male.  HEENT:  Normocephalic, no lesions, without obvious abnormality.  Normal external eye and conjunctiva.  Normal external ears. Normal external nose, mucus membranes and septum.  Normal pharynx. Cardiovascular: S1, S2 normal, pulses palpable throughout   Pulmonary: chest clear, no wheezing, rales, normal symmetric air entry Abdomen: soft, non-tender Extremities: no joint deformities, effusion, or inflammation Musculoskeletal: no joint tenderness,  deformity or swelling. Tone and bulk normal throughout; no atrophy noted Skin: warm and dry, no hyperpigmentation, vitiligo, or suspicious lesions  Neurological Examination:                                                                                               Mental Status: Tanner Yeley is Alert, oriented, thought content appropriate.  Speech dysarthric without evidence of aphasia. Able to follow 3-step commands without difficulty. Cranial Nerves: II: Visual fields grossly normal, pupils are equal, round, reactive to light. III,IV, VI: Ptosis not present, extra-ocular muscle movements intact bilaterally. Disconjugate gaze with slight exotropia of the left eye. No nystagmus noted. Hypometric saccades bilaterally. V,VII: Smile and eyebrow raise is flat on  the left. Facial light touch and pinprick sensation decreased on the left. VIII: Hearing grossly intact IX,X: Uvula and palate rise symmetrically XI: SCM and bilateral shoulder shrug strength 5/5 XII: Midline tongue extension Motor: Right :     Upper extremity   5/5   Left:     Upper extremity   4+/5          Lower extremity   5/5    Lower extremity   4+/5 Pronator drift present on the left Sensory: Temperature, pinprick and light touch sensation decreased on the left Deep Tendon Reflexes: 2+ and symmetric throughout Plantars: Right: downgoing   Left: downgoing Cerebellar: Finger-to-nose and heel-to-shin tests ataxic on the left. Proprioception: Vibratory sense intact, delayed on the left Gait: Not tested  Lab Results: Basic Metabolic Panel:  Recent Labs Lab 08/12/16 1041 08/12/16 1055  NA 139 143  K 3.5 3.6  CL 109 109  CO2 21*  --   GLUCOSE 173* 172*  BUN 20 24*  CREATININE 1.73* 1.70*  CALCIUM 9.1  --     Liver Function Tests:  Recent Labs Lab 08/12/16 1041  AST 20  ALT 16*  ALKPHOS 72  BILITOT 0.4  PROT 7.5  ALBUMIN 3.3*   No results for input(s): LIPASE, AMYLASE in the last 168 hours. No results for  input(s): AMMONIA in the last 168 hours.  CBC:  Recent Labs Lab 08/12/16 1041 08/12/16 1055  WBC 8.8  --   NEUTROABS 6.3  --   HGB 12.1* 12.6*  HCT 38.4* 37.0*  MCV 79.0  --   PLT 357  --     Cardiac Enzymes: No results for input(s): CKTOTAL, CKMB, CKMBINDEX, TROPONINI in the last 168 hours.  Lipid Panel: No results for input(s): CHOL, TRIG, HDL, CHOLHDL, VLDL, LDLCALC in the last 168 hours.  CBG:  Recent Labs Lab 08/06/16 1608 08/06/16 2137 08/07/16 0616 08/07/16 1135 08/07/16 1624  GLUCAP 99 142* 108* 97 103*    Microbiology: Results for orders placed or performed during the hospital encounter of 08/03/16  Urine Culture     Status: Abnormal   Collection Time: 08/03/16  5:39 PM  Result Value Ref Range Status   Specimen Description URINE, CLEAN CATCH  Final   Special Requests NONE  Final   Culture (A)  Final    <10,000 COLONIES/mL INSIGNIFICANT GROWTH Performed at [facility], [place] [address], [place], [place] [address]    Report Status 08/05/2016 FINAL  Final  Blood culture (routine x 2)     Status: None   Collection Time: 08/03/16  5:50 PM  Result Value Ref Range Status   Specimen Description LEFT ANTECUBITAL  Final   Special Requests   Final    BOTTLES DRAWN AEROBIC AND ANAEROBIC Blood Culture adequate volume   Culture NO GROWTH 5 DAYS  Final   Report Status 08/08/2016 FINAL  Final  Blood culture (routine x 2)     Status: None   Collection Time: 08/03/16  5:50 PM  Result Value Ref Range Status   Specimen Description BLOOD LEFT HAND  Final   Special Requests   Final    BOTTLES DRAWN AEROBIC AND ANAEROBIC Blood Culture adequate volume   Culture NO GROWTH 5 DAYS  Final   Report Status 08/08/2016 FINAL  Final    Coagulation Studies:  Recent Labs  08/12/16 1041  LABPROT 13.9  INR 1.07    Imaging: No results found.   Assessment and Plan:  66 year old  male with subacute stroke. Patient had recent stroke workup at this facility.  As such,  we are ordering an additional MRI to assess any new or evolving lesion as well as his pituitary mass, and recommend he follow with his outpatient neurologist.  Lupita Raider PA-C [facility]   08/12/2016, 12:42 PM Neuro hospitalist addendum 66 year old man with a past medical history of hypertension, type 2 diabetes, CAD and vitamin D deficiency who was recently seen and evaluated for a couple weeks of waxing and waning vertiginous feelings, dizziness ataxic gait and blurred vision comes in today for evaluation of worsening of same symptoms. Initial history by son mentioned that the patient had a TIA in the past, but turns out that he was seen and evaluated for small vessel stroke in the left brachium pontis/left cerebellar  and discharged recently with advise to f/u as outpatient.  ROS as documented above.  Examination Neurological examination Alert awake oriented 3. Next a speech is mildly dysarthric Cranial nerve exam positive for some diplopia with extreme leftward gaze, left nasolabial fold flattening, left ptosis and mild left lower face paralysis. Other cranial nerves intact. Motor exam revealed no vertical drift or pronator drift port on individual muscle testing patient had 4+/5 strength on left upper and lower extremity and 5/5 strength in the right upper and right lower extremity. Sensory exam was intact 2+ DTRs Coordination: Dysmetria seen on finger-nose-finger on the left  Gait was not tested  Labs reviewed as above MRI from last admission revealed a left brachium pontis/left cerebellar stroke, evidence of old thalamic and basal continuous stroke indicating a possible small vessel etiology. MRA of the head from the last admission was motion degraded and did not show any large vessel occlusion. Scattered atherosclerosis all over.  Assessment and plan 66 year old man with a past medical history of hypertension, diabetes, coronary artery disease and vitamin D  deficiency with a recent small vessel brachium pontis/cerebral peduncle stroke presents for evaluation of worsening of the same symptoms. He had an incidental finding of a pituitary mass for which she was asked to follow as an outpatient and get an MRI with and without contrast. Not a tPA candidate due to out of time window and recent stroke. Not a IA candidate due to no signs of LVO on exam.  Impression Possible evolution of the recent small vessel left brachium pontis/left cerebellar stroke Incidental pituitary mass  Recommendations -Since the patient has new symptoms/worsening symptoms, we would recommend repeating the MRI, this time with and without contrast so that the pituitary mass can also be evaluated. -Patient can be discharged home with follow-up with outpatient neurologist the imaging shows unchanged stroke or expected evolution of the stroke. -He should follow with outpatient neurology as recommended earlier. The plan was discussed with the patient's son at bedside.   Addendum after MRI of the brain with and without contrast MRI of the brain with and without contrast showed evolution of the left brachium pontis/left cerebellar stroke. It really demonstrated old basal ganglia, thalamic, pontine and left cerebellar infarcts. 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  Final recommendations -MRI shows expected evolution of the small vessel stroke. -Patient can be discharged home with follow-up with outpatient neurologist. -He should continue on aspirin 325 and statin. -He should also follow with outpatient neurosurgery for the pituitary macroadenoma At this time he has no visual symptoms concerning for symptomatic pituitary mass. -PT OT ST as previously recommended  Please call with questions  Amie Portland,  MD [facility] [phone]  If 7pm to 7am, please call on call as listed on AMION.

## 2016-09-23 ENCOUNTER — Encounter: Payer: Self-pay | Admitting: Diagnostic Neuroimaging

## 2016-09-23 ENCOUNTER — Ambulatory Visit (INDEPENDENT_AMBULATORY_CARE_PROVIDER_SITE_OTHER): Payer: BLUE CROSS/BLUE SHIELD | Admitting: Diagnostic Neuroimaging

## 2016-09-23 ENCOUNTER — Encounter (INDEPENDENT_AMBULATORY_CARE_PROVIDER_SITE_OTHER): Payer: Self-pay

## 2016-09-23 VITALS — BP 149/92 | HR 68 | Ht 68.0 in

## 2016-09-23 DIAGNOSIS — D352 Benign neoplasm of pituitary gland: Secondary | ICD-10-CM | POA: Diagnosis not present

## 2016-09-23 DIAGNOSIS — I63342 Cerebral infarction due to thrombosis of left cerebellar artery: Secondary | ICD-10-CM

## 2016-09-23 NOTE — Patient Instructions (Signed)
Thank you for coming to see [place] at [facility]. I hope we have been able to provide you high quality care today.  You may receive a patient satisfaction survey over the next few weeks. We would appreciate your feedback and comments so that we may continue to improve ourselves and the health of our patients.  - I will check lab testing  - I will setup eye doctor appointment  - continue current medications    ~~~~~~~~~~~~~~~~~~~~~~~~~~~~~~~~~~~~~~~~~~~~~~~~~~~~~~~~~~~~~~~~~  DR. PENUMALLI'S GUIDE TO HAPPY AND HEALTHY LIVING These are some of my general health and wellness recommendations. Some of them may apply to you better than others. Please use common sense as you try these suggestions and feel free to ask me any questions.   ACTIVITY/FITNESS Mental, social, emotional and physical stimulation are very important for brain and body health. Try learning a new activity (arts, music, language, sports, games).  Keep moving your body to the best of your abilities. You can do this at home, inside or outside, the park, community center, gym or anywhere you like. Consider a physical therapist or personal trainer to get started. Consider the app Sworkit. Fitness trackers such as smart-watches, smart-phones or Fitbits can help as well.   NUTRITION Eat more plants: colorful vegetables, nuts, seeds and berries.  Eat less sugar, salt, preservatives and processed foods.  Avoid toxins such as cigarettes and alcohol.  Drink water when you are thirsty. Warm water with a slice of lemon is an excellent morning drink to start the day.  Consider these websites for more information The Nutrition Source (https://www.henry-hernandez.biz/) [employer] Nutrition (WindowBlog.ch)   RELAXATION Consider practicing mindfulness meditation or other relaxation techniques such as deep breathing, prayer, yoga, tai chi, massage. See website mindful.org or  the apps Headspace or Calm to help get started.   SLEEP Try to get at least 7-8+ hours sleep per day. Regular exercise and reduced caffeine will help you sleep better. Practice good sleep hygeine techniques. See website sleep.org for more information.   PLANNING Prepare estate planning, living will, healthcare POA documents. Sometimes this is best planned with the help of an attorney. Theconversationproject.org and agingwithdignity.org are excellent resources.

## 2016-09-23 NOTE — Progress Notes (Signed)
[facility]  PATIENT: Blake Moran DOB: 26-Aug-1950  REFERRING CLINICIAN: Allyson Sabal / Kellie Shropshire / hospital follow up HISTORY FROM: patient, daughter, chart review REASON FOR VISIT: new consult    HISTORICAL  CHIEF COMPLAINT:  Chief Complaint  Patient presents with  . Cerebrovascular Accident    rm 7, New Pt, hospital FU, dgtr- [place], "still in therapy, PT/OT"     HISTORY OF PRESENT ILLNESS:   66 year old male here for hospital stroke discharge. Patient has history of hypertension, diabetes, stage III kidney disease, vitamin D deficiency, who presented to the hospital with fluctuating vertigo, gait difficulty, blurred vision. 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 He was recommended to have outpatient neurosurgery follow-up, but apparently they recommended to follow-up in neurology clinic first.  Since that time patient is stable. He is tolerating medications. Gait and balance is still affected.    REVIEW OF SYSTEMS: Full 14 system review of systems performed and negative with exception of: Only as per history of present illness.  ALLERGIES: Allergies  Allergen Reactions  . Lisinopril Swelling    HOME MEDICATIONS: Outpatient Medications Prior to Visit  Medication Sig Dispense Refill  . aspirin EC 325 MG tablet Take 1 tablet (325 mg total) by mouth daily. 30 tablet 3  . atorvastatin (LIPITOR) 20 MG tablet Take 1 tablet (20 mg total) by mouth daily at 6 PM. 30 tablet 2  . doxazosin (CARDURA) 8 MG tablet Take 8 mg by mouth at bedtime.    Marland Kitchen losartan-hydrochlorothiazide (HYZAAR) 100-12.5 MG tablet Take 1 tablet by mouth daily. 30 tablet 0  . Nebivolol HCl (BYSTOLIC) 20 MG TABS Take 20 mg by mouth daily.    . Vitamin D, Ergocalciferol, (DRISDOL) 50000 units CAPS capsule Take 50,000 Units by mouth every  Tuesday.    . promethazine (PHENERGAN) 12.5 MG tablet Take 12.5 mg by mouth every 6 (six) hours as needed for nausea or vomiting.    . senna-docusate (SENOKOT-S) 8.6-50 MG tablet Take 1 tablet by mouth at bedtime as needed for mild constipation. (Patient not taking: Reported on 09/23/2016) 30 tablet 1  . glipiZIDE (GLUCOTROL) 5 MG tablet Take 0.5 tablets (2.5 mg total) by mouth daily before breakfast. 60 tablet 11   No facility-administered medications prior to visit.     PAST MEDICAL HISTORY: Past Medical History:  Diagnosis Date  . BPH (benign prostatic hyperplasia)   . CKD (chronic kidney disease), stage III   . Diabetes mellitus without complication ([place])   . Hypertension   . Stroke ([place])   . Vitamin D deficiency     PAST SURGICAL HISTORY: Past Surgical History:  Procedure Laterality Date  . NO PAST SURGERIES      FAMILY HISTORY: Family History  Problem Relation Age of Onset  . Cancer Mother 36  . CAD Father 62  . Stroke Neg Hx     SOCIAL HISTORY:  Social History   Social History  . Marital status: Single    Spouse name: N/A  . Number of children: 2  . Years of education: 9   Occupational History  . 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 Smoking status: Former Smoker    Packs/day: 0.50    Years: 44.00    Quit date: 07/24/2016  . Smokeless tobacco: Never Used  . Alcohol use No  .  Drug use: No  . Sexual activity: Not on file   Other Topics Concern  . Not on file   Social History Narrative   Lives with aunt, uncle   Caffeine- tea, 1/2 glass in evening,  coffee w/breakfast     PHYSICAL EXAM  GENERAL EXAM/CONSTITUTIONAL: Vitals:  Vitals:   09/23/16 1057  BP: (!) 149/92  Pulse: 68  Height: _0  (1.727 m)     There is no height or weight on file to calculate BMI.  Visual Acuity Screening   Right eye Left eye Both eyes  Without correction: 20/40 20/30   With correction:        Patient is in no distress;  well developed, nourished and groomed; neck is supple  CARDIOVASCULAR:  Examination of carotid arteries is normal; no carotid bruits  Regular rate and rhythm, no murmurs  Examination of peripheral vascular system by observation and palpation is normal  EYES:  Ophthalmoscopic exam of optic discs and posterior segments is normal; no papilledema or hemorrhages  MUSCULOSKELETAL:  Gait, strength, tone, movements noted in Neurologic exam below  NEUROLOGIC: MENTAL STATUS:  No flowsheet data found.  awake, alert, oriented to person, place and time  recent and remote memory intact  normal attention and concentration  language fluent, comprehension intact, naming intact,   fund of knowledge appropriate  CRANIAL NERVE:   2nd - no papilledema on fundoscopic exam  2nd, 3rd, 4th, 6th - pupils equal and reactive to light, visual fields full to confrontation, extraocular muscles intact, no nystagmus  5th - facial sensation symmetric  7th - facial strength --> DECR LEFT EYEBROW RAISE; DECR LEFT LOWER STRENGTH ON SMILE  8th - hearing intact  9th - palate elevates symmetrically, uvula midline  11th - shoulder shrug symmetric  12th - tongue protrusion midline  MILD DYSARTHRIA  MOTOR:   normal bulk and tone, full strength in the BUE, BLE  SENSORY:   normal and symmetric to light touch, vibration  DECR PP, TEMP IN LEFT FACE, ARM, LEG  COORDINATION:   finger-nose-finger, fine finger movements --> MILD DYSMETRIA IN LEFT UPPER EXT  REFLEXES:   deep tendon reflexes TRACE and symmetric  GAIT/STATION:   narrow based gait; CANNOT TANDEM WALK; UNSTEADY WITH FEET TOGETHER AND EYES OPEN    DIAGNOSTIC DATA (LABS, IMAGING, TESTING) - I reviewed patient records, labs, notes, testing and imaging myself where available.  Lab Results  Component Value Date   WBC 8.8 08/12/2016   HGB 12.6 (L) 08/12/2016   HCT 37.0 (L) 08/12/2016   MCV 79.0 08/12/2016   PLT 357 08/12/2016        Component Value Date/Time   NA 143 08/12/2016 1055   K 3.6 08/12/2016 1055   CL 109 08/12/2016 1055   CO2 21 (L) 08/12/2016 1041   GLUCOSE 172 (H) 08/12/2016 1055   BUN 24 (H) 08/12/2016 1055   CREATININE 1.70 (H) 08/12/2016 1055   CALCIUM 9.1 08/12/2016 1041   PROT 7.5 08/12/2016 1041   ALBUMIN 3.3 (L) 08/12/2016 1041   AST 20 08/12/2016 1041   ALT 16 (L) 08/12/2016 1041   ALKPHOS 72 08/12/2016 1041   BILITOT 0.4 08/12/2016 1041   GFRNONAA 40 (L) 08/12/2016 1041   GFRAA 46 (L) 08/12/2016 1041   Lab Results  Component Value Date   CHOL 123 08/04/2016   HDL 29 (L) 08/04/2016   LDLCALC 70 08/04/2016   TRIG 122 08/04/2016   CHOLHDL 4.2 08/04/2016   Lab Results  Component Value  Date   HGBA1C 6.5 (H) 08/03/2016   No results found for: VITAMINB12 No results found for: TSH   08/04/16 CTA head / neck [I reviewed images myself and agree with interpretation. -VRP]  - Mild atherosclerotic disease at the carotid bifurcation regions but without stenosis. Cervical internal carotid artery is tortuous, consistent with the history of hypertension.  - Carotid siphon atherosclerosis but no stenosis greater than 50%. - No intracranial anterior circulation correctable proximal stenosis. Distal vessel atherosclerotic irregularity. - Abnormal appearance of the proximal left vertebral artery by CTA. I am not certain if this is due to artifact from shoulder density or if there is a small plaque or thrombus at the left vertebral artery origin. The vessel appears patent beyond that. I would favor this is artifactual, but cannot state that with certainty. If further imaging is desired, MR angiography with contrast could evaluate that location. - Atherosclerotic irregularity of the vertebral artery but without flow limiting stenosis. - Re- demonstration of sellar enlargement, apparently with a pituitary mass. See previous MRI recommendation.  08/04/16 MRI brain (with and without) [I reviewed  images myself and agree with interpretation. -VRP]  1. 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 2. Bilobed, contrast-enhancing intrasellar mass, most consistent with pituitary macroadenoma. No mass effect on the optic chiasm.  08/12/16 MRI brain (without) [I reviewed images myself and agree with interpretation. -VRP]  1. Further propagation of acute to subacute LEFT brachium pontis infarct. 2. Re- demonstration of old basal ganglia, thalamus, pontine and LEFT cerebellar small infarcts. 3. Stable moderate to severe chronic small vessel ischemic disease. 4. Re- demonstration of sellar mass, characterized as macroadenoma on MR sella protocol August 04, 2016.  08/04/16 TTE  - LVEF 60-65%, moderate LVH, normal wall motion, grade 1 DD,   indeterminate LV filling pressure, mild aortic stenosis - AVA   around 2.0 cm2, MAC with trivial MR, normal LA size, normal IVC.     ASSESSMENT AND PLAN  66 y.o. 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 Patient doing well from a stroke standpoint and recovery. Continue stroke risk factor reduction.  Regarding pituitary mass which is insensitive found, will set up formal visual field testing, check pituitary hormone labs, and then recommend follow-up with neurosurgery clinic.   Dx:  1. Pituitary macroadenoma ([place])   2. 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 Luteinizing Hormone  . Follicle Stimulating Hormone  . Alpha Subunit (Free)  . Prolactin  . Insulin-like growth factor  . Cortisol, urine, 24 hour  . TSH  . Testosterone  . Ambulatory referral to Ophthalmology   Return in about 3 months (around 12/24/2016).  I reviewed images, labs, notes, records myself. I summarized findings and reviewed with patient, for this high risk condition (stroke, pituitary mass) requiring high complexity decision making.     VPenni Bombard MD [id number] Certified in Neurology, Neurophysiology  and Neuroimaging  [facility] [address], [place], [place] [address] [phone]

## 2016-09-28 LAB — TSH: TSH: 0.616 u[IU]/mL (ref 0.450–4.500)

## 2016-09-28 LAB — TESTOSTERONE: TESTOSTERONE: 187 ng/dL — AB (ref 264–916)

## 2016-09-28 LAB — FOLLICLE STIMULATING HORMONE: FSH: 24.5 m[IU]/mL — ABNORMAL HIGH (ref 1.5–12.4)

## 2016-09-28 LAB — PROLACTIN: Prolactin: 7.1 ng/mL (ref 4.0–15.2)

## 2016-09-28 LAB — INSULIN-LIKE GROWTH FACTOR: Insulin-Like GF-1: 199 ng/mL — ABNORMAL HIGH (ref 49–188)

## 2016-09-28 LAB — LUTEINIZING HORMONE: LH: 11.9 m[IU]/mL — ABNORMAL HIGH (ref 1.7–8.6)

## 2016-09-28 LAB — ALPHA SUBUNIT (FREE): ALPHA SUBUNIT (FREE): 0.42 ng/mL

## 2016-10-07 ENCOUNTER — Ambulatory Visit: Payer: BLUE CROSS/BLUE SHIELD | Admitting: Neurology

## 2016-10-19 ENCOUNTER — Telehealth: Payer: Self-pay | Admitting: *Deleted

## 2016-10-19 DIAGNOSIS — D352 Benign neoplasm of pituitary gland: Secondary | ICD-10-CM

## 2016-10-19 NOTE — Telephone Encounter (Signed)
LMVM for pt at home # to return call for lab results.

## 2016-10-19 NOTE — Telephone Encounter (Signed)
-----   Message from Penni Bombard, MD sent at 10/19/2016  8:47 AM EDT ----- Mild hormone abnormalities noted --> (Low testosterone, elevated LH and FSH; slight elevation of IGF-1. 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 -VRP

## 2016-10-19 NOTE — Telephone Encounter (Signed)
Spoke to pt and relayed that his lab results showed mild hormone abnormalities.  Recommend f/u with NS and endocrinology.  He had appt in 12/2016 with opthamology.  He did not have any questions.

## 2016-10-20 NOTE — Telephone Encounter (Signed)
Referrals placed. -VRP

## 2016-10-20 NOTE — Addendum Note (Signed)
Addended by: Andrey Spearman R on: 10/20/2016 02:53 PM   Modules accepted: Orders

## 2016-10-22 ENCOUNTER — Telehealth: Payer: Self-pay | Admitting: *Deleted

## 2016-10-22 NOTE — Telephone Encounter (Signed)
Patient called office returning RN's call.  Please call °

## 2016-10-22 NOTE — Telephone Encounter (Signed)
LMVM for pt to return call about [employer] form.

## 2016-10-22 NOTE — Telephone Encounter (Signed)
Called and wife stated to call in am.

## 2016-10-23 NOTE — Telephone Encounter (Signed)
Spoke to pt this am.  He is not working from 08/06/2016.  He still has residual stroke deficitis, vision and ataxia.  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

## 2016-10-26 NOTE — Telephone Encounter (Signed)
Form placed on Dr. Hilbert Corrigan desk Friday for review and signature.

## 2016-12-30 ENCOUNTER — Ambulatory Visit: Payer: BLUE CROSS/BLUE SHIELD | Admitting: Diagnostic Neuroimaging

## 2017-01-11 ENCOUNTER — Ambulatory Visit: Payer: BLUE CROSS/BLUE SHIELD | Admitting: Endocrinology

## 2017-02-10 ENCOUNTER — Emergency Department (HOSPITAL_COMMUNITY): Payer: BLUE CROSS/BLUE SHIELD

## 2017-02-10 ENCOUNTER — Telehealth: Payer: Self-pay | Admitting: Diagnostic Neuroimaging

## 2017-02-10 ENCOUNTER — Emergency Department (HOSPITAL_COMMUNITY)
Admission: EM | Admit: 2017-02-10 | Discharge: 2017-02-10 | Disposition: A | Discharge status: Home / Self Care | Payer: BLUE CROSS/BLUE SHIELD | Attending: Emergency Medicine | Admitting: Emergency Medicine

## 2017-02-10 ENCOUNTER — Encounter (HOSPITAL_COMMUNITY): Payer: Self-pay | Admitting: *Deleted

## 2017-02-10 DIAGNOSIS — Z7984 Long term (current) use of oral hypoglycemic drugs: Secondary | ICD-10-CM | POA: Insufficient documentation

## 2017-02-10 DIAGNOSIS — K273 Acute peptic ulcer, site unspecified, without hemorrhage or perforation: Secondary | ICD-10-CM | POA: Insufficient documentation

## 2017-02-10 DIAGNOSIS — Z8673 Personal history of transient ischemic attack (TIA), and cerebral infarction without residual deficits: Secondary | ICD-10-CM | POA: Insufficient documentation

## 2017-02-10 DIAGNOSIS — E119 Type 2 diabetes mellitus without complications: Secondary | ICD-10-CM | POA: Diagnosis not present

## 2017-02-10 DIAGNOSIS — N183 Chronic kidney disease, stage 3 (moderate): Secondary | ICD-10-CM | POA: Diagnosis not present

## 2017-02-10 DIAGNOSIS — K279 Peptic ulcer, site unspecified, unspecified as acute or chronic, without hemorrhage or perforation: Secondary | ICD-10-CM

## 2017-02-10 DIAGNOSIS — Z79899 Other long term (current) drug therapy: Secondary | ICD-10-CM | POA: Insufficient documentation

## 2017-02-10 DIAGNOSIS — Z87891 Personal history of nicotine dependence: Secondary | ICD-10-CM | POA: Insufficient documentation

## 2017-02-10 DIAGNOSIS — I129 Hypertensive chronic kidney disease with stage 1 through stage 4 chronic kidney disease, or unspecified chronic kidney disease: Secondary | ICD-10-CM | POA: Diagnosis not present

## 2017-02-10 DIAGNOSIS — Z7982 Long term (current) use of aspirin: Secondary | ICD-10-CM | POA: Insufficient documentation

## 2017-02-10 DIAGNOSIS — R1013 Epigastric pain: Secondary | ICD-10-CM | POA: Diagnosis present

## 2017-02-10 LAB — CBC
HEMATOCRIT: 41.1 % (ref 39.0–52.0)
HEMOGLOBIN: 12.9 g/dL — AB (ref 13.0–17.0)
MCH: 25.2 pg — ABNORMAL LOW (ref 26.0–34.0)
MCHC: 31.4 g/dL (ref 30.0–36.0)
MCV: 80.4 fL (ref 78.0–100.0)
Platelets: 257 10*3/uL (ref 150–400)
RBC: 5.11 MIL/uL (ref 4.22–5.81)
RDW: 13.6 % (ref 11.5–15.5)
WBC: 9.1 10*3/uL (ref 4.0–10.5)

## 2017-02-10 LAB — LIPASE, BLOOD: Lipase: 28 U/L (ref 11–51)

## 2017-02-10 LAB — BASIC METABOLIC PANEL
ANION GAP: 15 (ref 5–15)
BUN: 12 mg/dL (ref 6–20)
CO2: 26 mmol/L (ref 22–32)
Calcium: 9.4 mg/dL (ref 8.9–10.3)
Chloride: 101 mmol/L (ref 101–111)
Creatinine, Ser: 1.37 mg/dL — ABNORMAL HIGH (ref 0.61–1.24)
GFR, EST NON AFRICAN AMERICAN: 53 mL/min — AB (ref >60)
GLUCOSE: 126 mg/dL — AB (ref 65–99)
POTASSIUM: 3.4 mmol/L — AB (ref 3.5–5.1)
Sodium: 142 mmol/L (ref 135–145)

## 2017-02-10 LAB — TROPONIN I

## 2017-02-10 MED ORDER — PANTOPRAZOLE SODIUM 40 MG PO TBEC: 40.0000 mg | DELAYED_RELEASE_TABLET | Freq: Every day | ORAL | 0 refills | Status: AC

## 2017-02-10 MED ORDER — CLARITHROMYCIN 500 MG PO TABS: 500.0000 mg | ORAL_TABLET | Freq: Two times a day (BID) | ORAL | 0 refills | Status: DC

## 2017-02-10 MED ORDER — PANTOPRAZOLE SODIUM 40 MG PO TBEC
40.0000 mg | DELAYED_RELEASE_TABLET | Freq: Once | ORAL | Status: AC
  Administered 2017-02-10: 40 mg via ORAL
  Filled 2017-02-10: qty 1

## 2017-02-10 MED ORDER — GI COCKTAIL ~~LOC~~
30.0000 mL | Freq: Once | ORAL | Status: AC
  Administered 2017-02-10: 30 mL via ORAL
  Filled 2017-02-10: qty 30

## 2017-02-10 NOTE — Discharge Instructions (Signed)
Follow up with your md in 2 weeks

## 2017-02-10 NOTE — ED Provider Notes (Signed)
[facility] EMERGENCY DEPARTMENT Provider Note   CSN: [id number] Arrival date & time: 02/10/17  1913     History   Chief Complaint Chief Complaint  Patient presents with  . Chest Pain    HPI Blake Moran is a 67 y.o. male.  Patient complains of epigastric abdominal pain.  He had an H. pylori test that was positive today.  He came in for second opinion   The history is provided by the patient. No language interpreter was used.  Abdominal Pain   This is a new problem. The current episode started more than 1 week ago. The problem occurs constantly. The problem has not changed since onset.The pain is associated with an unknown factor. The pain is located in the epigastric region. The quality of the pain is aching. Pertinent negatives include diarrhea, frequency, hematuria and headaches.    Past Medical History:  Diagnosis Date  . BPH (benign prostatic hyperplasia)   . CKD (chronic kidney disease), stage III ([place])   . Diabetes mellitus without complication ([place])   . Hypertension   . Stroke ([place])   . Vitamin D deficiency     Patient Active Problem List   Diagnosis Date Noted  . Pituitary mass ([place])   . Vertigo   . Morbid obesity ([place])   . Diastolic dysfunction   . Benign essential HTN   . Acute blood loss anemia   . Leukocytosis   . Hyperlipidemia   . Dizziness   . Elevated serum creatinine   . Urinary tract infection without hematuria   . CVA (cerebral vascular accident) ([place]) 08/03/2016  . Hypokalemia 08/03/2016  . CKD (chronic kidney disease), stage III ([place]) 08/03/2016  . Diabetes mellitus type 2 in obese ([place]) 08/03/2016  . 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 Essential hypertension 08/03/2016    Past Surgical History:  Procedure Laterality Date  . NO PAST SURGERIES         Home Medications    Prior to Admission medications   Medication Sig Start Date End Date Taking? Authorizing Provider  allopurinol  (ZYLOPRIM) 100 MG tablet Take 100 mg by mouth daily.  09/07/16  Yes [provider]  aspirin EC 325 MG tablet Take 1 tablet (325 mg total) by mouth daily. 08/06/16  Yes Reyne Dumas, MD  atorvastatin (LIPITOR) 20 MG tablet Take 1 tablet (20 mg total) by mouth daily at 6 PM. 08/06/16  Yes Reyne Dumas, MD  doxazosin (CARDURA) 8 MG tablet Take 8 mg by mouth at bedtime.   Yes [provider]  ferrous sulfate 325 (65 FE) MG tablet Take 325 mg by mouth daily with breakfast.   Yes [provider]  losartan-hydrochlorothiazide (HYZAAR) 100-12.5 MG tablet Take 1 tablet by mouth daily. 08/20/16  Yes Reyne Dumas, MD  metFORMIN (GLUCOPHAGE) 500 MG tablet Take 500 mg by mouth 2 (two) times daily with a meal.   Yes [provider]  Nebivolol HCl (BYSTOLIC) 20 MG TABS Take 20 mg by mouth daily.   Yes [provider]  Vitamin D, Ergocalciferol, (DRISDOL) 50000 units CAPS capsule Take 50,000 Units by mouth every Tuesday.   Yes [provider]  clarithromycin (BIAXIN) 500 MG tablet Take 1 tablet (500 mg total) by mouth 2 (two) times daily. 02/10/17   Milton Ferguson, MD  pantoprazole (PROTONIX) 40 MG tablet Take 1 tablet (40 mg total) by mouth daily. 02/10/17   Milton Ferguson, MD    Family History  Family History  Problem Relation Age of Onset  . Cancer Mother 67  . CAD Father 69  . Stroke Neg Hx     Social History Social History   Tobacco Use  . Smoking status: Former Smoker    Packs/day: 0.50    Years: 44.00    Pack years: 22.00    Last attempt to quit: 07/24/2016    Years since quitting: 0.5  . Smokeless tobacco: Never Used  Substance Use Topics  . Alcohol use: No  . Drug use: No     Allergies   Lisinopril   Review of Systems Review of Systems  Constitutional: Negative for appetite change and fatigue.  HENT: Negative for congestion, ear discharge and sinus pressure.   Eyes: Negative for discharge.  Respiratory: Negative for cough.     Cardiovascular: Negative for chest pain.  Gastrointestinal: Positive for abdominal pain. Negative for diarrhea.  Genitourinary: Negative for frequency and hematuria.  Musculoskeletal: Negative for back pain.  Skin: Negative for rash.  Neurological: Negative for seizures and headaches.  Psychiatric/Behavioral: Negative for hallucinations.     Physical Exam Updated Vital Signs BP (!) 166/90   Pulse (!) 58   Temp 98.6 F (37 C) (Oral)   Resp 15   Ht 5\' 8"  (1.727 m)   Wt 100.7 kg (222 lb)   SpO2 96%   BMI 33.75 kg/m   Physical Exam  Constitutional: He is oriented to person, place, and time. He appears well-developed.  HENT:  Head: Normocephalic.  Eyes: Conjunctivae and EOM are normal. No scleral icterus.  Neck: Neck supple. No thyromegaly present.  Cardiovascular: Normal rate and regular rhythm. Exam reveals no gallop and no friction rub.  No murmur heard. Pulmonary/Chest: No stridor. He has no wheezes. He has no rales. He exhibits no tenderness.  Abdominal: He exhibits no distension. There is tenderness. There is no rebound.  Tender epigastric  Musculoskeletal: Normal range of motion. He exhibits no edema.  Lymphadenopathy:    He has no cervical adenopathy.  Neurological: He is oriented to person, place, and time. He exhibits normal muscle tone. Coordination normal.  Skin: No rash noted. No erythema.  Psychiatric: He has a normal mood and affect. His behavior is normal.     ED Treatments / Results  Labs (all labs ordered are listed, but only abnormal results are displayed) Labs Reviewed  BASIC METABOLIC PANEL - Abnormal; Notable for the following components:      Result Value   Potassium 3.4 (*)    Glucose, Bld 126 (*)    Creatinine, Ser 1.37 (*)    GFR calc non Af Amer 53 (*)    All other components within normal limits  CBC - Abnormal; Notable for the following components:   Hemoglobin 12.9 (*)    MCH 25.2 (*)    All other components within normal limits   LIPASE, BLOOD  TROPONIN I    EKG  EKG Interpretation  Date/Time:  Wednesday February 10 2017 19:24:32 EST Ventricular Rate:  73 PR Interval:  186 QRS Duration: 98 QT Interval:  406 QTC Calculation: 447 R Axis:   -8 Text Interpretation:  Normal sinus rhythm Minimal voltage criteria for LVH, may be normal variant Borderline ECG Confirmed by Milton Ferguson [phone]) on 02/10/2017 8:38:58 PM       Radiology Dg Chest 2 View  Result Date: 02/10/2017 CLINICAL DATA:  Chest pain that comes and goes. EXAM: CHEST  2 VIEW COMPARISON:  08/03/2016 FINDINGS: Normal heart size and  mediastinal contours. Mild interstitial crowding at the bases attributed to low volumes. Lung volumes are better than before. There is no edema, consolidation, effusion, or pneumothorax. Apparent interface posteriorly at the base in the lateral projection is attributed to rotation and overlapping spinous process tips. IMPRESSION: No acute finding. Electronically Signed   By: Monte Fantasia M.D.   On: 02/10/2017 19:53    Procedures Procedures (including critical care time)  Medications Ordered in ED Medications  gi cocktail (Maalox,Lidocaine,Donnatal) (30 mLs Oral Given 02/10/17 2110)  pantoprazole (PROTONIX) EC tablet 40 mg (40 mg Oral Given 02/10/17 2248)     Initial Impression / Assessment and Plan / ED Course  I have reviewed the triage vital signs and the nursing notes.  Pertinent labs & imaging results that were available during my care of the patient were reviewed by me and considered in my medical decision making (see chart for details).     Patient with abdominal pain and positive H. pylori test.  He improved with GI cocktail he will be sent home with the Biaxin and Protonix.  And follow-up with his primary care  Final Clinical Impressions(s) / ED Diagnoses   Final diagnoses:  PUD (peptic ulcer disease)    ED Discharge Orders        Ordered    pantoprazole (PROTONIX) 40 MG tablet  Daily     02/10/17  2250    clarithromycin (BIAXIN) 500 MG tablet  2 times daily     02/10/17 2250       Milton Ferguson, MD 02/10/17 2255

## 2017-02-10 NOTE — ED Triage Notes (Signed)
Pt reports CP for 2-3 weeks, that comes and goes, pain has been worse through the night last night. Pain in the epigastric area, has had vomiting with the pain.

## 2017-02-10 NOTE — Telephone Encounter (Signed)
Called and left message for patient and relayed and Neurosurgery had been trying to call them for months to schedule apt. Patient has not returned any phone calls.

## 2017-05-19 ENCOUNTER — Ambulatory Visit: Payer: BLUE CROSS/BLUE SHIELD | Admitting: Diagnostic Neuroimaging

## 2017-08-03 ENCOUNTER — Encounter: Payer: Self-pay | Admitting: Diagnostic Neuroimaging

## 2017-08-03 ENCOUNTER — Ambulatory Visit (INDEPENDENT_AMBULATORY_CARE_PROVIDER_SITE_OTHER): Payer: Medicare Other | Admitting: Diagnostic Neuroimaging

## 2017-08-03 VITALS — BP 153/99 | HR 103 | Ht 68.0 in | Wt 238.2 lb

## 2017-08-03 DIAGNOSIS — I63342 Cerebral infarction due to thrombosis of left cerebellar artery: Secondary | ICD-10-CM

## 2017-08-03 DIAGNOSIS — E237 Disorder of pituitary gland, unspecified: Secondary | ICD-10-CM

## 2017-08-03 DIAGNOSIS — D352 Benign neoplasm of pituitary gland: Secondary | ICD-10-CM | POA: Diagnosis not present

## 2017-08-03 DIAGNOSIS — E236 Other disorders of pituitary gland: Secondary | ICD-10-CM

## 2017-08-03 NOTE — Patient Instructions (Addendum)
STROKE PREVENTION (left middle cerebellar peduncle infarct; due to small vessel thrombosis) - continue aspirin 325mg  daily - continue BP control, diabetes control - try to stop smoking  PITUITARY MASS (incidental finding) - Recommend follow up with neurosurgery and endocrinology clinics.

## 2017-08-03 NOTE — Progress Notes (Signed)
[facility]  PATIENT: Blake Moran DOB: Feb 07, 1950  REFERRING CLINICIAN: Allyson Sabal / Kellie Shropshire / hospital follow up HISTORY FROM: patient, daughter, chart review REASON FOR VISIT: follow up    HISTORICAL  CHIEF COMPLAINT:  Chief Complaint  Patient presents with  . Follow-up    Patient reports that he is doing well. His BP is up today.   . Cerebrovascular Accident    HISTORY OF PRESENT ILLNESS:   UPDATE (08/03/17, VRP): Since last visit, doing well. Stroke symptoms essentialy resolved. Tolerating meds. No alleviating or aggravating factors. Has been to eye doctor (visual fields good per patient). However, did not see endocrinology or neurosurgery for some reason. Patient tells me that he never heard from them. [facility] told us they could not reach patient.   PRIOR HPI (09/23/16): 67 year old male here for hospital stroke discharge. Patient has history of hypertension, diabetes, stage III kidney disease, vitamin D deficiency, who presented to the hospital with fluctuating vertigo, gait difficulty, blurred vision. 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 He was recommended to have outpatient neurosurgery follow-up, but apparently they recommended to follow-up in neurology clinic first.  Since that time patient is stable. He is tolerating medications. Gait and balance is still affected.    REVIEW OF SYSTEMS: Full 14 system review of systems performed and negative with exception of: high BP.   ALLERGIES: Allergies  Allergen Reactions  . Lisinopril Swelling    HOME MEDICATIONS: Outpatient Medications Prior to Visit  Medication Sig Dispense Refill  . allopurinol (ZYLOPRIM) 100 MG tablet Take 100 mg by mouth daily.   4  . aspirin EC 325 MG tablet Take 1 tablet (325 mg total) by mouth daily. 30 tablet 3  .  atorvastatin (LIPITOR) 20 MG tablet Take 1 tablet (20 mg total) by mouth daily at 6 PM. 30 tablet 2  . clarithromycin (BIAXIN) 500 MG tablet Take 1 tablet (500 mg total) by mouth 2 (two) times daily. 28 tablet 0  . doxazosin (CARDURA) 8 MG tablet Take 8 mg by mouth at bedtime.    . ferrous sulfate 325 (65 FE) MG tablet Take 325 mg by mouth daily with breakfast.    . losartan-hydrochlorothiazide (HYZAAR) 100-12.5 MG tablet Take 1 tablet by mouth daily. 30 tablet 0  . metFORMIN (GLUCOPHAGE) 500 MG tablet Take 500 mg by mouth 2 (two) times daily with a meal.    . Nebivolol HCl (BYSTOLIC) 20 MG TABS Take 20 mg by mouth daily.    . pantoprazole (PROTONIX) 40 MG tablet Take 1 tablet (40 mg total) by mouth daily. 30 tablet 0  . Vitamin D, Ergocalciferol, (DRISDOL) 50000 units CAPS capsule Take 50,000 Units by mouth every Tuesday.     No facility-administered medications prior to visit.     PAST MEDICAL HISTORY: Past Medical History:  Diagnosis Date  . BPH (benign prostatic hyperplasia)   . CKD (chronic kidney disease), stage III ([place])   . Diabetes mellitus without complication ([place])   . Hypertension   . Stroke ([place])   . Vitamin D deficiency     PAST SURGICAL HISTORY: Past Surgical History:  Procedure Laterality Date  . NO PAST SURGERIES      FAMILY HISTORY: Family History  Problem Relation Age of Onset  . Cancer Mother 59  . CAD Father 60  . Stroke Neg Hx     SOCIAL  HISTORY:  Social History   Socioeconomic History  . Marital status: Single    Spouse name: Not on file  . Number of children: 2  . Years of education: 9  . Highest education level: Not on file  Occupational History  . Occupation: Customer service manager    Comment: 09/23/16 not working  Social Needs  . Financial resource strain: Not on file  . Food insecurity:    Worry: Not on file    Inability: Not on file  . Transportation needs:    Medical: Not on file    Non-medical: Not on file  Tobacco Use  . Smoking  status: Former Smoker    Packs/day: 0.50    Years: 44.00    Pack years: 22.00    Last attempt to quit: 07/24/2016    Years since quitting: 1.0  . Smokeless tobacco: Never Used  Substance and Sexual Activity  . Alcohol use: No  . Drug use: No  . Sexual activity: Not on file  Lifestyle  . Physical activity:    Days per week: Not on file    Minutes per session: Not on file  . Stress: Not on file  Relationships  . Social connections:    Talks on phone: Not on file    Gets together: Not on file    Attends religious service: Not on file    Active member of club or organization: Not on file    Attends meetings of clubs or organizations: Not on file    Relationship status: Not on file  . Intimate partner violence:    Fear of current or ex partner: Not on file    Emotionally abused: Not on file    Physically abused: Not on file    Forced sexual activity: Not on file  Other Topics Concern  . Not on file  Social History Narrative   Lives with aunt, uncle   Caffeine- tea, 1/2 glass in evening,  coffee w/breakfast     PHYSICAL EXAM  GENERAL EXAM/CONSTITUTIONAL: Vitals:  Vitals:   08/03/17 0855  BP: (!) 153/99  Pulse: (!) 103  Weight: 238 lb 3.2 oz (108 kg)  Height: _0  (1.727 m)   Body mass index is 36.22 kg/m. No exam data present  Patient is in no distress; well developed, nourished and groomed; neck is supple  CARDIOVASCULAR:  Examination of carotid arteries is normal; no carotid bruits  Regular rate and rhythm, no murmurs  Examination of peripheral vascular system by observation and palpation is normal  EYES:  Ophthalmoscopic exam of optic discs and posterior segments is normal; no papilledema or hemorrhages  MUSCULOSKELETAL:  Gait, strength, tone, movements noted in Neurologic exam below  NEUROLOGIC: MENTAL STATUS:  No flowsheet data found.  awake, alert, oriented to person, place and time  recent and remote memory intact  normal attention and  concentration  language fluent, comprehension intact, naming intact,   fund of knowledge appropriate  CRANIAL NERVE:   2nd - no papilledema on fundoscopic exam  2nd, 3rd, 4th, 6th - pupils equal and reactive to light, visual fields full to confrontation, extraocular muscles intact, no nystagmus  5th - facial sensation symmetric  7th - facial strength --> symmetric  8th - hearing intact  9th - palate elevates symmetrically, uvula midline  11th - shoulder shrug symmetric  12th - tongue protrusion midline  MOTOR:   normal bulk and tone, full strength in the BUE, BLE  SENSORY:   normal and symmetric to light  touch, vibration, pinprick  COORDINATION:   finger-nose-finger, fine finger movements --> SUBTLE DYSMETRIA IN LEFT UPPER EXT  REFLEXES:   deep tendon reflexes TRACE and symmetric  GAIT/STATION:   narrow based gait; TANDEM STABLE; ROMBERG NEGATIVE    DIAGNOSTIC DATA (LABS, IMAGING, TESTING) - I reviewed patient records, labs, notes, testing and imaging myself where available.  Lab Results  Component Value Date   WBC 9.1 02/10/2017   HGB 12.9 (L) 02/10/2017   HCT 41.1 02/10/2017   MCV 80.4 02/10/2017   PLT 257 02/10/2017      Component Value Date/Time   NA 142 02/10/2017 2009   K 3.4 (L) 02/10/2017 2009   CL 101 02/10/2017 2009   CO2 26 02/10/2017 2009   GLUCOSE 126 (H) 02/10/2017 2009   BUN 12 02/10/2017 2009   CREATININE 1.37 (H) 02/10/2017 2009   CALCIUM 9.4 02/10/2017 2009   PROT 7.5 08/12/2016 1041   ALBUMIN 3.3 (L) 08/12/2016 1041   AST 20 08/12/2016 1041   ALT 16 (L) 08/12/2016 1041   ALKPHOS 72 08/12/2016 1041   BILITOT 0.4 08/12/2016 1041   GFRNONAA 53 (L) 02/10/2017 2009   GFRAA >60 02/10/2017 2009   Lab Results  Component Value Date   CHOL 123 08/04/2016   HDL 29 (L) 08/04/2016   LDLCALC 70 08/04/2016   TRIG 122 08/04/2016   CHOLHDL 4.2 08/04/2016   Lab Results  Component Value Date   HGBA1C 6.5 (H) 08/03/2016   No  results found for: VITAMINB12 Lab Results  Component Value Date   TSH 0.616 09/23/2016   09/23/16 Testosterone 264 - 916 ng/dL 187Low     LH 1.7 - 8.6 mIU/mL 11.9High     FSH 1.5 - 12.4 mIU/mL 24.5High     Alpha Subunit (Free) ng/mL 0.42    Prolactin 4.0 - 15.2 ng/mL 7.1    Insulin-Like GF-1 49 - 188 ng/mL 199High      08/04/16 CTA head / neck [I reviewed images myself and agree with interpretation. -VRP]  - Mild atherosclerotic disease at the carotid bifurcation regions but without stenosis. Cervical internal carotid artery is tortuous, consistent with the history of hypertension.  - Carotid siphon atherosclerosis but no stenosis greater than 50%. - No intracranial anterior circulation correctable proximal stenosis. Distal vessel atherosclerotic irregularity. - Abnormal appearance of the proximal left vertebral artery by CTA. I am not certain if this is due to artifact from shoulder density or if there is a small plaque or thrombus at the left vertebral artery origin. The vessel appears patent beyond that. I would favor this is artifactual, but cannot state that with certainty. If further imaging is desired, MR angiography with contrast could evaluate that location. - Atherosclerotic irregularity of the vertebral artery but without flow limiting stenosis. - Re- demonstration of sellar enlargement, apparently with a pituitary mass. See previous MRI recommendation.  08/04/16 MRI brain (with and without) [I reviewed images myself and agree with interpretation. -VRP]  1. 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 2. Bilobed, contrast-enhancing intrasellar mass, most consistent with pituitary macroadenoma. No mass effect on the optic chiasm.  08/12/16 MRI brain (without) [I reviewed images myself and agree with interpretation. -VRP]  1. Further propagation of acute to subacute LEFT brachium pontis infarct. 2.  Re- demonstration of old basal ganglia, thalamus, pontine and LEFT cerebellar small infarcts. 3. Stable moderate to severe chronic small vessel ischemic disease. 4. Re- demonstration of sellar  mass, characterized as macroadenoma on MR sella protocol August 04, 2016.  08/04/16 TTE  - LVEF 60-65%, moderate LVH, normal wall motion, grade 1 DD,   indeterminate LV filling pressure, mild aortic stenosis - AVA   around 2.0 cm2, MAC with trivial MR, normal LA size, normal IVC.     ASSESSMENT AND PLAN  67 y.o. 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 Patient doing well from a stroke standpoint and recovery. Continue stroke risk factor reduction.  Regarding pituitary mass which is incidentally found, will have patient follow up with endocrinology, then neurosurgery clinics (referrals placed in August 2018; patient could not be reached and never follow up).   Dx:  1. Pituitary mass ([place])      PLAN:  STROKE PREVENTION (left middle cerebellar peduncle infarct; due to small vessel thrombosis) - continue aspirin 351m daily - continue BP control, diabetes control - continue smoking cessation - continue PT - nutrition and fitness improvements reviewed  PITUITARY MASS (incidental finding) - Mild hormone abnormalities noted --> (Low testosterone, elevated LH and FSH; slight elevation of IGF-1. 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  Orders Placed This Encounter  Procedures  . Ambulatory referral to Neurosurgery  . Ambulatory referral to Endocrinology   Return in about 6 months (around 02/03/2018) for with NP. for 1 more visit, then return to PCP     VPenni Bombard MD 78/04/2547 98:26AM Certified in Neurology, Neurophysiology and Neuroimaging  [facility] [address] [place] [phone]

## 2017-09-28 NOTE — Progress Notes (Deleted)
Patient ID: Blake Moran, male   DOB: 1950-08-12, 67 y.o.   MRN: [id number]          Referring physician:  Chief complaint:  History of Present Illness:     Past Medical History:  Diagnosis Date  . BPH (benign prostatic hyperplasia)   . CKD (chronic kidney disease), stage III ([place])   . Diabetes mellitus without complication ([place])   . Hypertension   . Stroke ([place])   . Vitamin D deficiency     Past Surgical History:  Procedure Laterality Date  . NO PAST SURGERIES      Family History  Problem Relation Age of Onset  . Cancer Mother 29  . CAD Father 60  . Stroke Neg Hx     Social History:  reports that he quit smoking about 14 months ago. He has a 22.00 pack-year smoking history. He has never used smokeless tobacco. He reports that he does not drink alcohol or use drugs.  Allergies:  Allergies  Allergen Reactions  . Lisinopril Swelling    Allergies as of 09/29/2017      Reactions   Lisinopril Swelling      Medication List        Accurate as of 09/28/17  9:38 PM. Always use your most recent med list.          allopurinol 100 MG tablet Commonly known as:  ZYLOPRIM Take 100 mg by mouth daily.   aspirin EC 325 MG tablet Take 1 tablet (325 mg total) by mouth daily.   atorvastatin 20 MG tablet Commonly known as:  LIPITOR Take 1 tablet (20 mg total) by mouth daily at 6 PM.   BYSTOLIC 20 MG Tabs Generic drug:  Nebivolol HCl Take 20 mg by mouth daily.   clarithromycin 500 MG tablet Commonly known as:  BIAXIN Take 1 tablet (500 mg total) by mouth 2 (two) times daily.   doxazosin 8 MG tablet Commonly known as:  CARDURA Take 8 mg by mouth at bedtime.   ferrous sulfate 325 (65 FE) MG tablet Take 325 mg by mouth daily with breakfast.   losartan-hydrochlorothiazide 100-12.5 MG tablet Commonly known as:  HYZAAR Take 1 tablet by mouth daily.   metFORMIN 500 MG tablet Commonly known as:  GLUCOPHAGE Take 500 mg by mouth 2 (two) times daily with a meal.   pantoprazole 40 MG tablet Commonly known as:  PROTONIX Take 1 tablet (40 mg total) by mouth daily.   Vitamin D (Ergocalciferol) 50000 units Caps capsule Commonly known as:  DRISDOL Take 50,000 Units by mouth every Tuesday.       LABS:  No visits with results within 1 Week(s) from this visit.  Latest known visit with results is:  Admission on 02/10/2017, Discharged on 02/10/2017  Component Date Value Ref Range Status  . Sodium 02/10/2017 142  135 - 145 mmol/L Final  . Potassium 02/10/2017 3.4* 3.5 - 5.1 mmol/L Final  . Chloride 02/10/2017 101  101 - 111 mmol/L Final  . CO2 02/10/2017 26  22 - 32 mmol/L Final  . Glucose, Bld 02/10/2017 126* 65 - 99 mg/dL Final  . BUN 02/10/2017 12  6 - 20 mg/dL Final  . Creatinine, Ser 02/10/2017 1.37* 0.61 - 1.24 mg/dL Final  . Calcium 02/10/2017 9.4  8.9 - 10.3 mg/dL Final  . GFR calc non Af Amer 02/10/2017 53* >60 mL/min Final  . GFR calc Af Amer 02/10/2017 >60  >60 mL/min Final   Comment: (NOTE) The eGFR has been  calculated using the CKD EPI equation. This calculation has not been validated in all clinical situations. eGFR's persistently <60 mL/min signify possible Chronic Kidney Disease.   . Anion gap 02/10/2017 15  5 - 15 Final  . WBC 02/10/2017 9.1  4.0 - 10.5 K/uL Final  . RBC 02/10/2017 5.11  4.22 - 5.81 MIL/uL Final  . Hemoglobin 02/10/2017 12.9* 13.0 - 17.0 g/dL Final  . HCT 02/10/2017 41.1  39.0 - 52.0 % Final  . MCV 02/10/2017 80.4  78.0 - 100.0 fL Final  . MCH 02/10/2017 25.2* 26.0 - 34.0 pg Final  . MCHC 02/10/2017 31.4  30.0 - 36.0 g/dL Final  . RDW 02/10/2017 13.6  11.5 - 15.5 % Final  . Platelets 02/10/2017 257  150 - 400 K/uL Final  . Lipase 02/10/2017 28  11 - 51 U/L Final  . Troponin I 02/10/2017 <0.03  <0.03 ng/mL Final        Review of Systems   PHYSICAL EXAM:  There were no vitals taken for this visit.  GENERAL:  No pallor, clubbing, lymphadenopathy or edema.  Skin:  no rash or pigmentation.  EYES:   Externally normal.  Fundii:  normal discs and vessels.  ENT: Oral mucosa and tongue normal.  THYROID:  Not palpable.  HEART:  Normal  S1 and S2; no murmur or click.  CHEST:  Normal shape.  Lungs: Vescicular breath sounds heard equally.  No crepitations/ wheeze.  ABDOMEN:  No distention.  Liver and spleen not palpable.  No other mass or tenderness.  NEUROLOGICAL: .Reflexes are bilaterally at ankles.  JOINTS:  Normal.   ASSESSMENT:       PLAN:       Consultation note sent to the referring physician  Elayne Snare 09/28/2017, 9:38 PM

## 2017-09-29 ENCOUNTER — Ambulatory Visit: Payer: PRIVATE HEALTH INSURANCE | Admitting: Endocrinology

## 2017-09-29 DIAGNOSIS — Z0289 Encounter for other administrative examinations: Secondary | ICD-10-CM

## 2018-02-15 ENCOUNTER — Ambulatory Visit: Payer: Medicare Other | Admitting: Nurse Practitioner

## 2018-03-01 NOTE — Progress Notes (Signed)
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  His stroke symptoms have resolved and he is neurologically he is stable.  He remains on aspirin for secondary stroke prevention without bruising or bleeding and no further stroke or TIA symptoms.  He remains on Lipitor without myalgias he claims his blood sugars are between 101 and 102 daily when he checks his CBGs.  Blood pressure mildly elevated in the office today 160/96.  He did follow-up with Dr.Nundkumar neurosurgery 10/20/2017 is not having any significant visual symptoms and his MRI is being followed.  His most recent MRI 09/09/2017 is essentially with stable appearance of the macroadenoma.  He was given the number for endocrinology who needs to follow this as well.  He returns for reevaluation  UPDATE (08/03/17, VRP): Since last visit, doing well. Stroke symptoms essentialy resolved. Tolerating meds. No alleviating or aggravating factors. Has been to eye doctor (visual fields good per patient). However, did not see endocrinology or neurosurgery for some reason. Patient tells me that he never heard from them. [facility] told us they could not reach patient.   PRIOR HPI (09/23/16): 68 year old male here for hospital stroke discharge. Patient has history of hypertension, diabetes, stage III kidney disease, vitamin D deficiency, who presented to the hospital with fluctuating vertigo, gait difficulty, blurred vision. 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 He was recommended to have outpatient neurosurgery follow-up, but apparently  they recommended to follow-up in neurology clinic first.  Since that time patient is stable. He is tolerating medications. Gait and balance is still affected.    REVIEW OF SYSTEMS: Full 14 system review of systems performed and notable only for those listed, all others are neg:  Constitutional: neg  Cardiovascular: neg Ear/Nose/Throat: neg  Skin: neg Eyes: neg Respiratory: neg Gastroitestinal: neg  Hematology/Lymphatic: neg  Endocrine: neg Musculoskeletal:neg Allergy/Immunology: neg Neurological: neg Psychiatric: neg Sleep : neg   ALLERGIES: Allergies  Allergen Reactions  . Lisinopril Swelling    HOME MEDICATIONS: Outpatient Medications Prior to Visit  Medication Sig Dispense Refill  . allopurinol (ZYLOPRIM) 100 MG tablet Take 100 mg by mouth daily.   4  . amLODipine (NORVASC) 10 MG tablet Take 10 mg by mouth daily.    Marland Kitchen aspirin EC 325 MG tablet Take 1 tablet (325 mg total) by mouth daily. 30 tablet 3  . atorvastatin (LIPITOR) 20 MG tablet Take 1 tablet (20 mg total) by mouth daily at 6 PM. 30 tablet 2  . doxazosin (CARDURA) 8 MG tablet Take 8 mg by mouth at bedtime.    . ferrous sulfate 325 (65 FE) MG tablet Take 325 mg by mouth daily with breakfast.    . hydrochlorothiazide (HYDRODIURIL) 25 MG tablet Take 25 mg by mouth daily.    Marland Kitchen latanoprost (XALATAN) 0.005 % ophthalmic solution     . losartan (COZAAR) 100 MG tablet 100 mg daily.    . metFORMIN (GLUCOPHAGE) 500 MG tablet Take 500 mg by mouth 2 (two) times daily with a meal.    . Nebivolol HCl (BYSTOLIC) 20 MG TABS Take 20 mg by mouth daily.    . pantoprazole (PROTONIX) 40 MG tablet Take 1 tablet (  40 mg total) by mouth daily. 30 tablet 0  . timolol (TIMOPTIC) 0.25 % ophthalmic solution INSTILL 1 DROP INTO EACH EYE TWICE DAILY    . Vitamin D, Ergocalciferol, (DRISDOL) 50000 units CAPS capsule Take 50,000 Units by mouth every Tuesday.    . clarithromycin (BIAXIN) 500 MG tablet Take 1 tablet (500 mg total) by mouth 2  (two) times daily. 28 tablet 0  . losartan-hydrochlorothiazide (HYZAAR) 100-12.5 MG tablet Take 1 tablet by mouth daily. 30 tablet 0   No facility-administered medications prior to visit.     PAST MEDICAL HISTORY: Past Medical History:  Diagnosis Date  . BPH (benign prostatic hyperplasia)   . CKD (chronic kidney disease), stage III ([place])   . Diabetes mellitus without complication ([place])   . Hypertension   . Stroke ([place])   . Vitamin D deficiency     PAST SURGICAL HISTORY: Past Surgical History:  Procedure Laterality Date  . NO PAST SURGERIES      FAMILY HISTORY: Family History  Problem Relation Age of Onset  . Cancer Mother 66  . CAD Father 66  . Stroke Neg Hx     SOCIAL HISTORY: Social History   Socioeconomic History  . Marital status: Single    Spouse name: Not on file  . Number of children: 2  . Years of education: 9  . Highest education level: Not on file  Occupational History  . Occupation: Customer service manager    Comment: 09/23/16 not working  Social Needs  . Financial resource strain: Not on file  . Food insecurity:    Worry: Not on file    Inability: Not on file  . Transportation needs:    Medical: Not on file    Non-medical: Not on file  Tobacco Use  . Smoking status: Former Smoker    Packs/day: 0.50    Years: 44.00    Pack years: 22.00    Last attempt to quit: 07/24/2016    Years since quitting: 1.6  . Smokeless tobacco: Never Used  Substance and Sexual Activity  . Alcohol use: No  . Drug use: No  . Sexual activity: Not on file  Lifestyle  . Physical activity:    Days per week: Not on file    Minutes per session: Not on file  . Stress: Not on file  Relationships  . Social connections:    Talks on phone: Not on file    Gets together: Not on file    Attends religious service: Not on file    Active member of club or organization: Not on file    Attends meetings of clubs or organizations: Not on file    Relationship status: Not on file  .  Intimate partner violence:    Fear of current or ex partner: Not on file    Emotionally abused: Not on file    Physically abused: Not on file    Forced sexual activity: Not on file  Other Topics Concern  . Not on file  Social History Narrative   Lives with aunt, uncle   Caffeine- tea, 1/2 glass in evening,  coffee w/breakfast     PHYSICAL EXAM  Vitals:   03/02/18 1516  BP: (!) 160/96  Pulse: (!) 112  Weight: 239 lb (108.4 kg)  Height: 5\' 8"  (1.727 m)   Body mass index is 36.34 kg/m.  Generalized: Well developed, in no acute distress  Head: normocephalic and atraumatic,. Oropharynx benign  Neck: Supple, no carotid bruits  Cardiac: Regular  rate rhythm, no murmur  Musculoskeletal: No deformity   Neurological examination   Mentation: Alert oriented to time, place, history taking. Attention span and concentration appropriate. Recent and remote memory intact.  Follows all commands speech and language fluent.   Cranial nerve II-XII: .Pupils were equal round reactive to light extraocular movements were full, visual field were full on confrontational test. Facial sensation and strength were normal. hearing was intact to finger rubbing bilaterally. Uvula tongue midline. head turning and shoulder shrug were normal and symmetric.Tongue protrusion into cheek strength was normal. Motor: normal bulk and tone, full strength in the BUE, BLE, Sensory: normal and symmetric to light touch, pinprick, and  Vibration, in the upper and lower extremities Coordination: finger-nose-finger, heel-to-shin bilaterally, no dysmetria Reflexes: Symmetric upper and lower plantar responses were flexor bilaterally. Gait and Station: Rising up from seated position without assistance, normal stance,  moderate stride, good arm swing, smooth turning, able to perform tiptoe, and heel walking without difficulty. Tandem gait is steady.  No assistive device  DIAGNOSTIC DATA (LABS, IMAGING, TESTING) - I reviewed  patient records, labs, notes, testing and imaging myself where available.  Lab Results  Component Value Date   WBC 9.1 02/10/2017   HGB 12.9 (L) 02/10/2017   HCT 41.1 02/10/2017   MCV 80.4 02/10/2017   PLT 257 02/10/2017      Component Value Date/Time   NA 142 02/10/2017 2009   K 3.4 (L) 02/10/2017 2009   CL 101 02/10/2017 2009   CO2 26 02/10/2017 2009   GLUCOSE 126 (H) 02/10/2017 2009   BUN 12 02/10/2017 2009   CREATININE 1.37 (H) 02/10/2017 2009   CALCIUM 9.4 02/10/2017 2009   PROT 7.5 08/12/2016 1041   ALBUMIN 3.3 (L) 08/12/2016 1041   AST 20 08/12/2016 1041   ALT 16 (L) 08/12/2016 1041   ALKPHOS 72 08/12/2016 1041   BILITOT 0.4 08/12/2016 1041   GFRNONAA 53 (L) 02/10/2017 2009   GFRAA >60 02/10/2017 2009   Lab Results  Component Value Date   CHOL 123 08/04/2016   HDL 29 (L) 08/04/2016   LDLCALC 70 08/04/2016   TRIG 122 08/04/2016   CHOLHDL 4.2 08/04/2016   Lab Results  Component Value Date   HGBA1C 6.5 (H) 08/03/2016   No results found for: [id number] Lab Results  Component Value Date   TSH 0.616 09/23/2016      ASSESSMENT AND PLAN 68 y.o. 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 Patient doing well from a stroke standpoint and recovery. Continue stroke risk factor reduction. Regarding pituitary mass which is incidentally found, He did follow-up with Dr.Nundkumar neurosurgery 10/20/2017 is not having any significant visual symptoms and his MRI is being followed.  His most recent MRI 09/09/2017 is essentially with stable appearance of the macroadenoma.   PLAN: Stressed the importance of management of risk factors to prevent further stroke Continue ASA for secondary stroke prevention Maintain strict control of hypertension with blood pressure goal below 130/90, today's reading 160/96 continue antihypertensive medications Control of diabetes with  hemoglobin A1c below 6.5 followed by primary care continue diabetic medications Cholesterol with LDL cholesterol less than 70, followed by primary care, continue statin drug Lipitor Exercise by walking,   eat healthy diet with whole grains,  fresh fruits and vegetables Follow-up with primary care for stroke risk factor modification, maintain blood pressure goal less than 160 systolic, diabetes with V3X below 7, lipids with  LDL below 70 F/U with [facility] [phone] Pt did F/U with neurosurgery  Dr. Kathyrn Sheriff on 08/26/17 and 10/20/2017 Will discharge from stroke clinic Dennie Bible, [facility], [facility], [facility] [address], [place], [place] [address] [phone]

## 2018-03-02 ENCOUNTER — Ambulatory Visit (INDEPENDENT_AMBULATORY_CARE_PROVIDER_SITE_OTHER): Payer: Medicare Other | Admitting: Nurse Practitioner

## 2018-03-02 ENCOUNTER — Encounter: Payer: Self-pay | Admitting: Nurse Practitioner

## 2018-03-02 VITALS — BP 160/96 | HR 112 | Ht 68.0 in | Wt 239.0 lb

## 2018-03-02 DIAGNOSIS — I6300 Cerebral infarction due to thrombosis of unspecified precerebral artery: Secondary | ICD-10-CM | POA: Diagnosis not present

## 2018-03-02 DIAGNOSIS — E236 Other disorders of pituitary gland: Secondary | ICD-10-CM

## 2018-03-02 DIAGNOSIS — E785 Hyperlipidemia, unspecified: Secondary | ICD-10-CM | POA: Diagnosis not present

## 2018-03-02 DIAGNOSIS — I10 Essential (primary) hypertension: Secondary | ICD-10-CM | POA: Diagnosis not present

## 2018-03-02 NOTE — Progress Notes (Signed)
I reviewed note and agree with plan.   Penni Bombard, MD 4/79/9872, 1:58 PM Certified in Neurology, Neurophysiology and Neuroimaging  [facility] [address] [place], [place] [address] [phone]

## 2018-03-02 NOTE — Patient Instructions (Signed)
Stressed the importance of management of risk factors to prevent further stroke Continue ASA for secondary stroke prevention Maintain strict control of hypertension with blood pressure goal below 130/90, today's reading 160/96 continue antihypertensive medications Control of diabetes with hemoglobin A1c below 6.5 followed by primary care continue diabetic medications Cholesterol with LDL cholesterol less than 70, followed by primary care, continue statin drug Lipitor Exercise by walking,   eat healthy diet with whole grains,  fresh fruits and vegetables Follow-up with primary care for stroke risk factor modification, maintain blood pressure goal less than 436 systolic, diabetes with I1M below 7, lipids with LDL below 70 F/U with [facility] [phone] Pt did F/U with neurosurgery  Dr. Kathyrn Sheriff on 08/26/17 Will discharge from stroke clinic

## 2018-06-24 IMAGING — DX DG CHEST 2V
2 series · 2 of 2 positions shown · non-contrast
Comparison: 08/03/2016

CLINICAL DATA: Chest pain that comes and goes.

EXAM:
CHEST  2 VIEW

[chest pa]
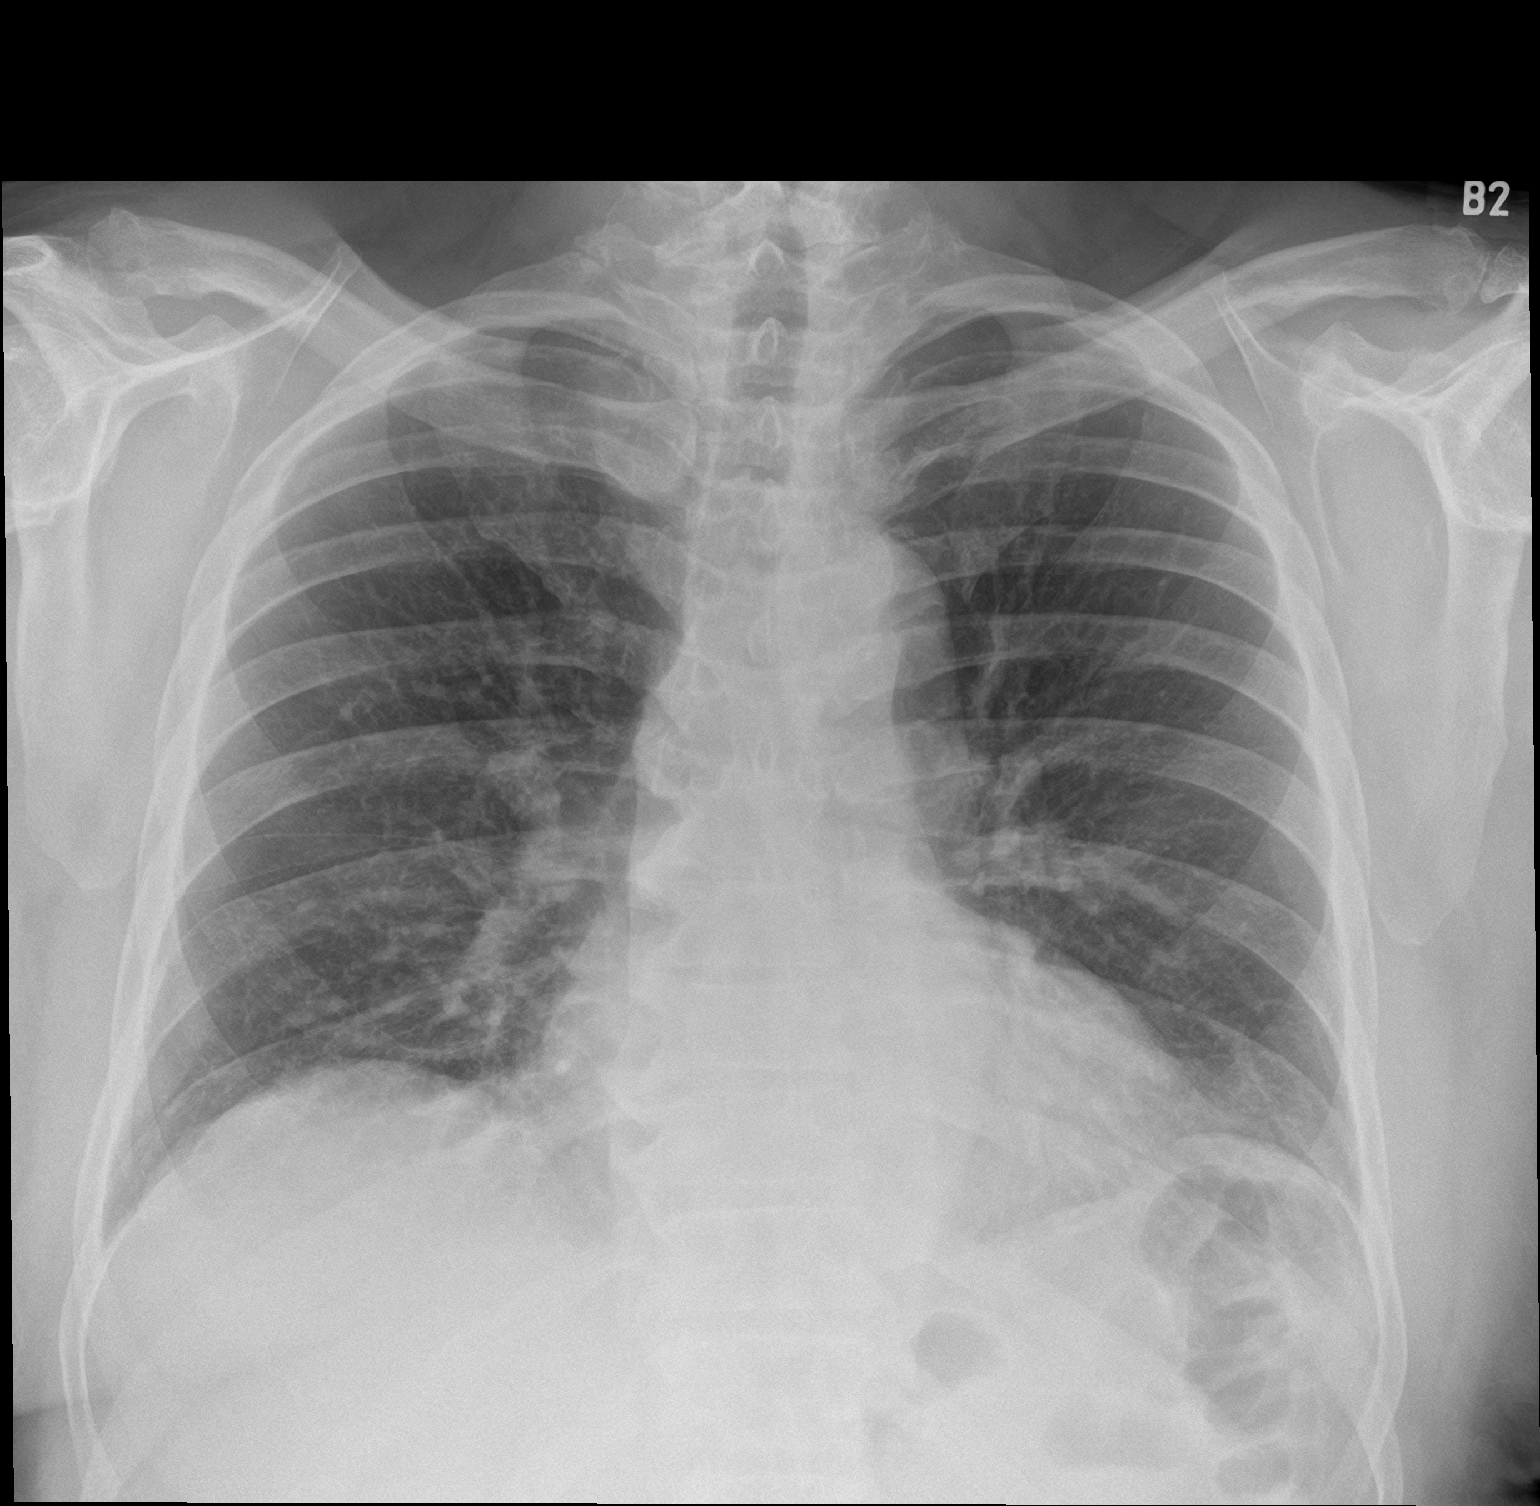

[chest lat]
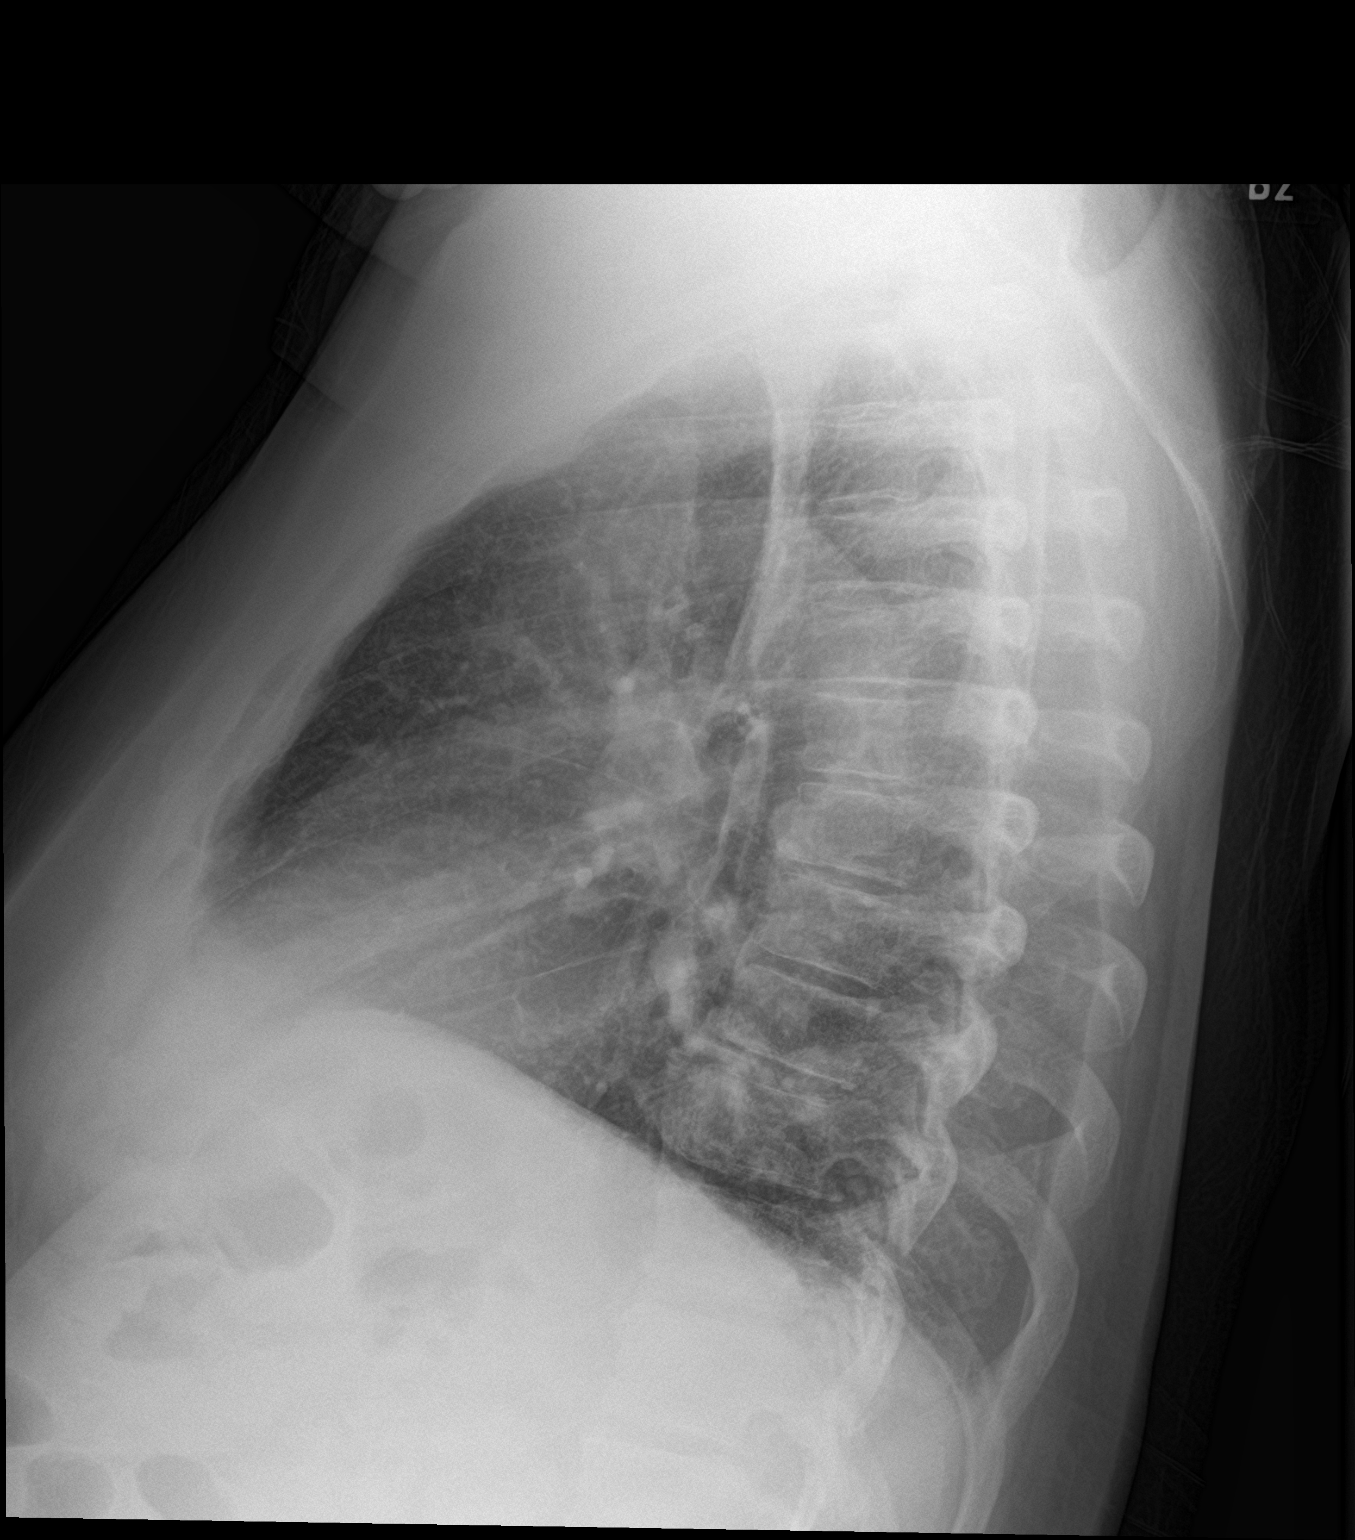

[2 of 2 positions shown; findings below may reference images not displayed]

FINDINGS: Normal heart size and mediastinal contours. Mild interstitial
crowding at the bases attributed to low volumes. Lung volumes are
better than before. There is no edema, consolidation, effusion, or
pneumothorax. Apparent interface posteriorly at the base in the
lateral projection is attributed to rotation and overlapping spinous
process tips.
IMPRESSION: No acute finding.

## 2019-05-08 ENCOUNTER — Ambulatory Visit: Payer: PRIVATE HEALTH INSURANCE | Attending: Internal Medicine

## 2019-05-08 ENCOUNTER — Other Ambulatory Visit: Payer: Self-pay

## 2019-05-08 DIAGNOSIS — Z20822 Contact with and (suspected) exposure to covid-19: Secondary | ICD-10-CM

## 2019-05-10 LAB — SARS-COV-2, NAA 2 DAY TAT

## 2019-05-10 LAB — NOVEL CORONAVIRUS, NAA: SARS-CoV-2, NAA: NOT DETECTED

## 2019-05-12 ENCOUNTER — Telehealth: Payer: Self-pay | Admitting: General Practice

## 2019-05-12 NOTE — Telephone Encounter (Signed)
Patient requesting recent covid-19 lab results faxed to number below. Patient has already contacted [facility].   Fax to:  [place]  [phone]

## 2019-05-12 NOTE — Telephone Encounter (Signed)
Attempted to contact pt. Re: request to fax COVID test results.  Left voice message, advising that it is [place] to request records to be faxed through [facility]; phone # for [facility] [phone]; option #3.  Left vm for pt. to return call to [phone], if further questions.

## 2020-09-16 ENCOUNTER — Other Ambulatory Visit (HOSPITAL_COMMUNITY): Payer: Self-pay | Admitting: Neurosurgery

## 2020-09-16 ENCOUNTER — Other Ambulatory Visit: Payer: Self-pay | Admitting: Neurosurgery

## 2020-09-16 DIAGNOSIS — D352 Benign neoplasm of pituitary gland: Secondary | ICD-10-CM
